# Patient Record
Sex: Female | Born: 1940 | Race: White | Hispanic: No | Marital: Married | Smoking: Current every day smoker
Health system: Southern US, Community
[De-identification: ages and names within clinical notes are randomized; demographics above are authoritative.]

## PROBLEM LIST (undated history)

## (undated) DIAGNOSIS — I10 Essential (primary) hypertension: Secondary | ICD-10-CM

## (undated) DIAGNOSIS — K5909 Other constipation: Secondary | ICD-10-CM

## (undated) DIAGNOSIS — K219 Gastro-esophageal reflux disease without esophagitis: Secondary | ICD-10-CM

## (undated) HISTORY — PX: APPENDECTOMY: SHX54

## (undated) HISTORY — PX: ABDOMINAL HYSTERECTOMY: SHX81

---

## 2014-08-17 ENCOUNTER — Emergency Department: Payer: Self-pay | Admitting: Emergency Medicine

## 2014-08-17 LAB — CBC WITH DIFFERENTIAL/PLATELET
Basophil #: 0 10*3/uL (ref 0.0–0.1)
Basophil %: 0.3 %
Eosinophil #: 0.1 10*3/uL (ref 0.0–0.7)
Eosinophil %: 0.9 %
HCT: 39.8 % (ref 35.0–47.0)
HGB: 13.3 g/dL (ref 12.0–16.0)
Lymphocyte #: 2.7 10*3/uL (ref 1.0–3.6)
Lymphocyte %: 29.2 %
MCH: 30.7 pg (ref 26.0–34.0)
MCHC: 33.4 g/dL (ref 32.0–36.0)
MCV: 92 fL (ref 80–100)
Monocyte #: 0.6 x10 3/mm (ref 0.2–0.9)
Monocyte %: 6.3 %
Neutrophil #: 5.8 10*3/uL (ref 1.4–6.5)
Neutrophil %: 63.3 %
Platelet: 271 10*3/uL (ref 150–440)
RBC: 4.33 10*6/uL (ref 3.80–5.20)
RDW: 13.2 % (ref 11.5–14.5)
WBC: 9.2 10*3/uL (ref 3.6–11.0)

## 2014-08-17 LAB — TROPONIN I: Troponin-I: <0.02 ng/mL

## 2014-08-17 LAB — URINALYSIS, COMPLETE
Bilirubin,UR: NEGATIVE
Glucose,UR: NEGATIVE mg/dL (ref 0–75)
Ketone: NEGATIVE
Leukocyte Esterase: NEGATIVE
Nitrite: NEGATIVE
Ph: 5 (ref 4.5–8.0)
Protein: NEGATIVE
Specific Gravity: 1.025 (ref 1.003–1.030)
Squamous Epithelial: >30

## 2014-08-17 LAB — COMPREHENSIVE METABOLIC PANEL
Albumin: 3.2 g/dL — ABNORMAL LOW (ref 3.4–5.0)
Alkaline Phosphatase: 83 U/L
Anion Gap: 8 (ref 7–16)
BUN: 19 mg/dL — ABNORMAL HIGH (ref 7–18)
Bilirubin,Total: 0.5 mg/dL (ref 0.2–1.0)
Calcium, Total: 9.1 mg/dL (ref 8.5–10.1)
Chloride: 104 mmol/L (ref 98–107)
Co2: 28 mmol/L (ref 21–32)
Creatinine: 0.78 mg/dL (ref 0.60–1.30)
EGFR (African American): >60
EGFR (Non-African Amer.): >60
Glucose: 123 mg/dL — ABNORMAL HIGH (ref 65–99)
Osmolality: 283 (ref 275–301)
Potassium: 3.7 mmol/L (ref 3.5–5.1)
SGOT(AST): 25 U/L (ref 15–37)
SGPT (ALT): 14 U/L
Sodium: 140 mmol/L (ref 136–145)
Total Protein: 7.4 g/dL (ref 6.4–8.2)

## 2014-08-17 LAB — LIPASE, BLOOD: Lipase: 67 U/L — ABNORMAL LOW (ref 73–393)

## 2014-08-21 ENCOUNTER — Encounter (HOSPITAL_COMMUNITY): Payer: Self-pay | Admitting: Emergency Medicine

## 2014-08-21 ENCOUNTER — Emergency Department (HOSPITAL_COMMUNITY)
Admission: EM | Admit: 2014-08-21 | Discharge: 2014-08-21 | Disposition: A | Discharge status: Home / Self Care | Payer: Medicare Other | Attending: Emergency Medicine | Admitting: Emergency Medicine

## 2014-08-21 DIAGNOSIS — M549 Dorsalgia, unspecified: Secondary | ICD-10-CM | POA: Insufficient documentation

## 2014-08-21 DIAGNOSIS — F17200 Nicotine dependence, unspecified, uncomplicated: Secondary | ICD-10-CM | POA: Diagnosis not present

## 2014-08-21 DIAGNOSIS — G8929 Other chronic pain: Secondary | ICD-10-CM | POA: Insufficient documentation

## 2014-08-21 DIAGNOSIS — Z79899 Other long term (current) drug therapy: Secondary | ICD-10-CM | POA: Diagnosis not present

## 2014-08-21 DIAGNOSIS — R1013 Epigastric pain: Secondary | ICD-10-CM | POA: Diagnosis present

## 2014-08-21 DIAGNOSIS — K5900 Constipation, unspecified: Secondary | ICD-10-CM | POA: Insufficient documentation

## 2014-08-21 DIAGNOSIS — K219 Gastro-esophageal reflux disease without esophagitis: Secondary | ICD-10-CM | POA: Diagnosis not present

## 2014-08-21 DIAGNOSIS — Z9089 Acquired absence of other organs: Secondary | ICD-10-CM | POA: Diagnosis not present

## 2014-08-21 DIAGNOSIS — F1210 Cannabis abuse, uncomplicated: Secondary | ICD-10-CM | POA: Insufficient documentation

## 2014-08-21 DIAGNOSIS — Z90710 Acquired absence of both cervix and uterus: Secondary | ICD-10-CM | POA: Insufficient documentation

## 2014-08-21 HISTORY — DX: Gastro-esophageal reflux disease without esophagitis: K21.9

## 2014-08-21 HISTORY — DX: Other constipation: K59.09

## 2014-08-21 LAB — CBG MONITORING, ED: Glucose-Capillary: 107 mg/dL — ABNORMAL HIGH (ref 70–99)

## 2014-08-21 LAB — CBC WITH DIFFERENTIAL/PLATELET
Basophils Absolute: 0 10*3/uL (ref 0.0–0.1)
Basophils Relative: 1 % (ref 0–1)
Eosinophils Absolute: 0.1 10*3/uL (ref 0.0–0.7)
Eosinophils Relative: 1 % (ref 0–5)
HCT: 39.3 % (ref 36.0–46.0)
Hemoglobin: 12.9 g/dL (ref 12.0–15.0)
Lymphocytes Relative: 36 % (ref 12–46)
Lymphs Abs: 3.1 10*3/uL (ref 0.7–4.0)
MCH: 30.1 pg (ref 26.0–34.0)
MCHC: 32.8 g/dL (ref 30.0–36.0)
MCV: 91.6 fL (ref 78.0–100.0)
Monocytes Absolute: 0.5 10*3/uL (ref 0.1–1.0)
Monocytes Relative: 6 % (ref 3–12)
Neutro Abs: 5 10*3/uL (ref 1.7–7.7)
Neutrophils Relative %: 56 % (ref 43–77)
Platelets: 268 10*3/uL (ref 150–400)
RBC: 4.29 MIL/uL (ref 3.87–5.11)
RDW: 13.4 % (ref 11.5–15.5)
WBC: 8.7 10*3/uL (ref 4.0–10.5)

## 2014-08-21 LAB — COMPREHENSIVE METABOLIC PANEL
ALT: 12 U/L (ref 0–35)
AST: 21 U/L (ref 0–37)
Albumin: 3.5 g/dL (ref 3.5–5.2)
Alkaline Phosphatase: 83 U/L (ref 39–117)
Anion gap: 15 (ref 5–15)
BUN: 19 mg/dL (ref 6–23)
CO2: 27 mEq/L (ref 19–32)
Calcium: 9.7 mg/dL (ref 8.4–10.5)
Chloride: 98 mEq/L (ref 96–112)
Creatinine, Ser: 0.66 mg/dL (ref 0.50–1.10)
GFR calc Af Amer: >90 mL/min (ref >90)
GFR calc non Af Amer: 84 mL/min — ABNORMAL LOW (ref >90)
Glucose, Bld: 101 mg/dL — ABNORMAL HIGH (ref 70–99)
Potassium: 3.5 mEq/L — ABNORMAL LOW (ref 3.7–5.3)
Sodium: 140 mEq/L (ref 137–147)
Total Bilirubin: 0.5 mg/dL (ref 0.3–1.2)
Total Protein: 7.4 g/dL (ref 6.0–8.3)

## 2014-08-21 MED ORDER — DOCUSATE SODIUM 100 MG PO CAPS: 100.0000 mg | ORAL_CAPSULE | Freq: Two times a day (BID) | ORAL | Status: DC

## 2014-08-21 MED ORDER — GI COCKTAIL ~~LOC~~
30.0000 mL | Freq: Once | ORAL | Status: AC
  Administered 2014-08-21: 30 mL via ORAL
  Filled 2014-08-21: qty 30

## 2014-08-21 MED ORDER — HYDROCODONE-ACETAMINOPHEN 5-325 MG PO TABS: 1.0000 | ORAL_TABLET | ORAL | Status: DC | PRN

## 2014-08-21 NOTE — ED Notes (Signed)
Pt family member becoming agitated. Delay explained to pt family. Pt family member remains agitated. AC notified.

## 2014-08-21 NOTE — Discharge Instructions (Signed)

## 2014-08-21 NOTE — ED Notes (Addendum)
Pt reports was diagnosed with Peptic Ulcer when she was evaluated at [facility] on Tuesday.

## 2014-08-21 NOTE — ED Notes (Addendum)
Pt c/o abdominal pain and loss of appetite since June. Pt states its a "burning pain" and states "I cannot go to the bathroom without medicine, it's been over a week, and pain gets worse when they eat." Pt also stated that she vomited Tuesday night and they took her to [facility]. Denies any diarrhea. Pt denies any blood in stool.

## 2014-08-21 NOTE — ED Provider Notes (Signed)
CSN: [id number]     Arrival date & time 08/21/14  1116 History  This chart was scribed for Christy Gaskins, MD by Elon Spanner, ED Scribe. This patient was seen in room APA03/APA03 and the patient's care was started at 3:22 PM.   Chief Complaint  Patient presents with  . Abdominal Pain   Patient is a 73 y.o. female presenting with abdominal pain. The history is provided by the patient. No language interpreter was used.  Abdominal Pain Pain location:  Epigastric Pain radiates to:  Back Pain severity:  Moderate Onset quality:  Gradual Duration:  2 weeks Timing:  Constant Chronicity:  Chronic Relieved by:  Nothing Worsened by:  Eating Ineffective treatments:  None tried Associated symptoms: constipation   Associated symptoms: no chest pain and no fever     HPI Comments: Christy Mccarthy is a 73 y.o. female who presents to the Emergency Department complaining of epigastric abdominal pain that worsened mildly in June and markedly 1 week ago with intermittent radiation through to her back.  Patient states the pain is worse after eating any food and reports current constipation with her most recent bowel movement 2 weeks ago. Patient reports 1 episode of emesis on 8/26. Patient was seen for a similar complaint on 8/25 on Tuesday and received several abdominal imaging studies and was diagnosed with a peptic ulcer.  She was prescribed Protonix and one other unknown medication.   Patient has a history of appendectomy and hysterectomy but denies other abdominal surgeries.  Patient denies CP, fever, melena, bloody stool.  No NSAID use  She is pain free at this time  PCP: none Past Medical History  Diagnosis Date  . Chronic abdominal pain   . Chronic constipation   . GERD (gastroesophageal reflux disease)    Past Surgical History  Procedure Laterality Date  . Appendectomy    . Abdominal hysterectomy     No family history on file. History  Substance Use Topics  . Smoking status: Current  Every Day Smoker -- 0.50 packs/day for 25 years  . Smokeless tobacco: Not on file  . Alcohol Use: No   OB History   Grav Para Term Preterm Abortions TAB SAB Ect Mult Living                 Review of Systems  Constitutional: Negative for fever.  Cardiovascular: Negative for chest pain.  Gastrointestinal: Positive for abdominal pain and constipation. Negative for blood in stool.  Musculoskeletal: Positive for back pain.  All other systems reviewed and are negative.   Allergies  Sucralfate and Sulfa antibiotics  Home Medications   Prior to Admission medications   Medication Sig Start Date End Date Taking? Authorizing Provider  pantoprazole (PROTONIX) 40 MG tablet Take 40 mg by mouth daily. 08/17/14  Yes Historical Provider, MD   BP 165/85  Pulse 62  Temp(Src) 98.8 F (37.1 C) (Oral)  Resp 16  Ht  (1.702 m)  Wt 133 lb (60.328 kg)  BMI 20.83 kg/m2  SpO2 100% Physical Exam  Nursing note and vitals reviewed.   CONSTITUTIONAL: Well developed/well nourished HEAD: Normocephalic/atraumatic EYES: EOMI/PERRL ENMT: Mucous membranes moist NECK: supple no meningeal signs SPINE:entire spine nontender CV: S1/S2 noted, no murmurs/rubs/gallops noted LUNGS: Lungs are clear to auscultation bilaterally, no apparent distress ABDOMEN: soft, nontender, no rebound or guarding GU:no cva tenderness NEURO: Pt is awake/alert, moves all extremitiesx4 EXTREMITIES: pulses normal, full ROM SKIN: warm, color normal PSYCH: no abnormalities of mood noted  ED  Course  Procedures   DIAGNOSTIC STUDIES: Oxygen Saturation is 100% on RA, normal by my interpretation.    COORDINATION OF CARE:  3:30 PM Discussed treatment plan with patient at bedside.  Patient acknowledges and agrees with plan.    Pt reports extensive workup recently in [place] including negative abdominal US I doubt acute cholecystitis.  I don't feel she needs emergent imaging and she is well appearing without pain She has no  pain at this time, I doubt pancreatitis Low suspicion for ACS She has no evidence of acute abdominal emergency She requests meds for pain and constipation Discussed strict return precautions Referred to local GI but advised I could not guarantee when she would be able to see them as outpatient and advised to call tomorrow for followup  Labs Review Labs Reviewed  COMPREHENSIVE METABOLIC PANEL - Abnormal; Notable for the following:    Potassium 3.5 (*)    Glucose, Bld 101 (*)    GFR calc non Af Amer 84 (*)    All other components within normal limits  CBG MONITORING, ED - Abnormal; Notable for the following:    Glucose-Capillary 107 (*)    All other components within normal limits  CBC WITH DIFFERENTIAL      MDM   Final diagnoses:  Epigastric pain    Nursing notes including past medical history and social history reviewed and considered in documentation Labs/vital reviewed and considered   I personally performed the services described in this documentation, which was scribed in my presence. The recorded information has been reviewed and is accurate.      Christy Gaskins, MD 08/21/14 [phone]

## 2014-08-22 ENCOUNTER — Telehealth: Payer: Self-pay | Admitting: Gastroenterology

## 2014-08-22 NOTE — Telephone Encounter (Signed)
LMOM to call.

## 2014-08-22 NOTE — Telephone Encounter (Signed)
Noted.  Will await to see patient's response once she is triaged by [facility].

## 2014-08-22 NOTE — Telephone Encounter (Signed)
Pt returned my call. She said she hurts in the top of her stomach whenever she eats. Also, stays constipated. She takes 3-4 laxatives at once time and it will be a couple of weeks before she has a BM. She said it has been 2 weeks now and she is miserable. She wanted to be seen ASAP but we do not have anything urgent for awhile. Please advise!

## 2014-08-22 NOTE — Telephone Encounter (Signed)
Pt called this morning wanting to make OV with SF ONLY. She had been seen in the ER over the weekend for abd pain. I told her that I could get her in with extender this Friday or she could see SF on 10/22 which is her next available. Pt said that she would call me back to see what she wanted to do. She calls back at noon asking for Friday appointment and it has already been taken for another patient. Patient got huffy that I didn't save it for her. I told her that I had another cancellation with [facility] on 9/16 at 10 and I could put her on the schedule and just keep an eye out for any sooner appts that may come up. She is getting frustrated that she can't be a walk in and can't be seen any sooner than what I have offered. Please advise if this is an Urgent matter where patient can be seen sooner. She can be reached at [phone]

## 2014-08-23 NOTE — Telephone Encounter (Signed)
PLEASE CALL PT. UNFORTUNATELY WE DO NOT HAVE ANY APPTS SOONER THAN SEP 16.   SHE SHOULD USE MIRALAX 17 GM IN 8 OZ LIQUID Q1H FOR FROM 12N TO 3 PM. DRINK 1 CUP LIQUID 30 MINS AFTER EACH DOSE. SHE SHOULD TAKE ON DOSES ON TUES AND WED THEN START MIRALAX TID.

## 2014-08-23 NOTE — Telephone Encounter (Signed)
I called and informed pt. She is aware of the instructions for the miralax. She is also aware of her appt with Tana Coast, PA on 09/07/2014 at 10:00 AM.

## 2014-09-02 ENCOUNTER — Other Ambulatory Visit: Payer: Self-pay | Admitting: Gastroenterology

## 2014-09-02 ENCOUNTER — Ambulatory Visit (INDEPENDENT_AMBULATORY_CARE_PROVIDER_SITE_OTHER): Payer: Medicare Other | Admitting: Gastroenterology

## 2014-09-02 ENCOUNTER — Encounter: Payer: Self-pay | Admitting: Gastroenterology

## 2014-09-02 VITALS — BP 143/82 | HR 91 | Temp 97.6°F | Ht 67.0 in | Wt 131.4 lb

## 2014-09-02 DIAGNOSIS — K219 Gastro-esophageal reflux disease without esophagitis: Secondary | ICD-10-CM

## 2014-09-02 DIAGNOSIS — K5900 Constipation, unspecified: Secondary | ICD-10-CM | POA: Insufficient documentation

## 2014-09-02 DIAGNOSIS — R634 Abnormal weight loss: Secondary | ICD-10-CM

## 2014-09-02 DIAGNOSIS — R1013 Epigastric pain: Secondary | ICD-10-CM

## 2014-09-02 MED ORDER — LINACLOTIDE 290 MCG PO CAPS: 290.0000 ug | ORAL_CAPSULE | Freq: Every day | ORAL | Status: DC

## 2014-09-02 MED ORDER — PANTOPRAZOLE SODIUM 40 MG PO TBEC: 40.0000 mg | DELAYED_RELEASE_TABLET | Freq: Every day | ORAL | Status: AC

## 2014-09-02 NOTE — Assessment & Plan Note (Signed)
Chronic constipation. Currently poorly managed. No prior colonoscopy. All for colonoscopy at this time the patient wanted to pursue upper endoscopy first. She also states that she will not take any sort of liquid bowel prep. Vomiting with all liquid medication including MiraLax recently. Possible to use pill prep. Also consider Cologuard if she decides to avoid any bowel prep. To discuss again later when she is ready.   Start Linzess daily. Stop dulcolax, but may use prn if needed on occasion.

## 2014-09-02 NOTE — Patient Instructions (Signed)
1. For constipation, start Linzess one daily on empty stomach. Prescription sent to your pharmacy. 2. For stomach pain, continue pantoprazole one daily. Prescription sent to your pharmacy. 3. You should consider colonoscopy at a later date. We can offer you a pill prep.  4. I suspect you may have gastritis or stomach ulcer since your symptoms have improved with pantoprazole. Therefore, we will perform an upper endoscopy to further evaluate. See separate instructions.   Peptic Ulcer A peptic ulcer is a sore in the lining of your esophagus (esophageal ulcer), stomach (gastric ulcer), or in the first part of your small intestine (duodenal ulcer). The ulcer causes erosion into the deeper tissue. CAUSES  Normally, the lining of the stomach and the small intestine protects itself from the acid that digests food. The protective lining can be damaged by:  An infection caused by a bacterium called Helicobacter pylori (H. pylori).  Regular use of nonsteroidal anti-inflammatory drugs (NSAIDs), such as ibuprofen or aspirin.  Smoking tobacco. Other risk factors include being older than 50, drinking alcohol excessively, and having a family history of ulcer disease.  SYMPTOMS   Burning pain or gnawing in the area between the chest and the belly button.  Heartburn.  Nausea and vomiting.  Bloating. The pain can be worse on an empty stomach and at night. If the ulcer results in bleeding, it can cause:  Black, tarry stools.  Vomiting of bright red blood.  Vomiting of coffee-ground-looking materials. DIAGNOSIS  A diagnosis is usually made based upon your history and an exam. Other tests and procedures may be performed to find the cause of the ulcer. Finding a cause will help determine the best treatment. Tests and procedures may include:  Blood tests, stool tests, or breath tests to check for the bacterium H. pylori.  An upper gastrointestinal (GI) series of the esophagus, stomach, and small  intestine.  An endoscopy to examine the esophagus, stomach, and small intestine.  A biopsy. TREATMENT  Treatment may include:  Eliminating the cause of the ulcer, such as smoking, NSAIDs, or alcohol.  Medicines to reduce the amount of acid in your digestive tract.  Antibiotic medicines if the ulcer is caused by the H. pylori bacterium.  An upper endoscopy to treat a bleeding ulcer.  Surgery if the bleeding is severe or if the ulcer created a hole somewhere in the digestive system. HOME CARE INSTRUCTIONS   Avoid tobacco, alcohol, and caffeine. Smoking can increase the acid in the stomach, and continued smoking will impair the healing of ulcers.  Avoid foods and drinks that seem to cause discomfort or aggravate your ulcer.  Only take medicines as directed by your caregiver. Do not substitute over-the-counter medicines for prescription medicines without talking to your caregiver.  Keep any follow-up appointments and tests as directed. SEEK MEDICAL CARE IF:   Your do not improve within 7 days of starting treatment.  You have ongoing indigestion or heartburn. SEEK IMMEDIATE MEDICAL CARE IF:   You have sudden, sharp, or persistent abdominal pain.  You have bloody or dark black, tarry stools.  You vomit blood or vomit that looks like coffee grounds.  You become light-headed, weak, or feel faint.  You become sweaty or clammy. MAKE SURE YOU:   Understand these instructions.  Will watch your condition.  Will get help right away if you are not doing well or get worse. Document Released: 12/06/2000 Document Revised: 04/25/2014 Document Reviewed: 07/08/2012 [facility] Patient Information 2015 [place], [place]. This information is not intended to  replace advice given to you by your health care provider. Make sure you discuss any questions you have with your health care provider.  

## 2014-09-02 NOTE — Assessment & Plan Note (Signed)
73 year old lady with three-month history of progressive postprandial epigastric pain associated with anorexia, 14 pound weight loss. Denies any NSAIDs or aspirin. Some improvement on pantoprazole recently. She reports negative gallbladder ultrasound recently. We have requested those records. Suspect gastritis or peptic ulcer disease. Differential also includes mesenteric ischemia, risk factor of chronic tobacco use.  Upper endoscopy planned.  I have discussed the risks, alternatives, benefits with regards to but not limited to the risk of reaction to medication, bleeding, infection, perforation and the patient is agreeable to proceed. Written consent to be obtained.  Continue pantoprazole 40 mg daily. Refills provided.

## 2014-09-02 NOTE — Progress Notes (Signed)
Primary Care Physician:  No PCP Per Patient  Primary Gastroenterologist:  Barney Drain, MD   Chief Complaint  Patient presents with  . Abdominal Pain    HPI:  Christy Mccarthy is a 73 y.o. female here as a self-referral for further evaluation of a worsening abdominal pain. She reports that over the past 10 years she has had issues with constipation and gas pains. She's had to take medication to have a bowel movement for years. Generally takes Dulcolax once per week. Chronically had been on Zantac for heartburn. However in June she started having increased issues. She started having postprandial epigastric/mid abdominal pain. Symptoms escalated last month. She started avoiding meals due to postprandial pain. She's had a 14 pound weight loss. Denies nausea or vomiting. No diarrhea. Bowel movement once per week. Denies melena or rectal bleeding.  She has been seen at [facility] regional ER, urgent care, [facility] ER for these symptoms. She does not have a PCP. She lives in [place] and refuses see doctors there.   Seen in urgent care on 08/13/2014. At that time she had normal CBC, met 7, TSH. Negative H. pylori antibody. Switch from Zantac to omeprazole 20 mg twice a day. Patient tells me that she or her try this at home so she didn't try again.  Seen at [facility] regional ER on 08/17/2014. She reports having abdominal ultrasound and x-ray but we do not have his records, they have been requested. She brought a copy of her lab work which showed troponin less than 0.32, white blood cell count 9200, hemoglobin 13.3, platelets 271,000, glucose 123, BUN 19, creatinine 0.78, sodium 140, potassium 3.7, calcium 9.1, total bilirubin 0.5, alkaline phosphatase 83, ALT 14, AST 25, albumin 3.2, lipase 67. Urinalysis showed 1+ blood, 0-5 rbc's on centrifuge specimen due to insufficient specimen. Started on pantoprazole 08/17/14. Given carafate tabs, "worsened my pain".    Seen [facility] ER 08/21/14. CBC, CMET  as below, unremarkable.  Some improvement on protonix. Have not used but one of vicodin.  Afraid to eat more than soup and jello. C/o severe constipation. Vomits with liquid medicine and could not tolerate miralax. Refuses colonoscopy if she has to take liquid prep. No prior colonoscopy.   Fears having cancer. Husband died of esophageal cancer.   Current Outpatient Prescriptions  Medication Sig Dispense Refill  . HYDROcodone-acetaminophen (NORCO/VICODIN) 5-325 MG per tablet Take 1 tablet by mouth every 4 (four) hours as needed for moderate pain or severe pain.  10 tablet  0  . pantoprazole (PROTONIX) 40 MG tablet Take 40 mg by mouth daily.       No current facility-administered medications for this visit.    Allergies as of 09/02/2014 - Review Complete 09/02/2014  Allergen Reaction Noted  . Sucralfate Other (See Comments) 08/21/2014  . Sulfa antibiotics Other (See Comments) 08/21/2014    Past Medical History  Diagnosis Date  . Chronic constipation   . GERD (gastroesophageal reflux disease)     Past Surgical History  Procedure Laterality Date  . Appendectomy    . Abdominal hysterectomy      Family History  Problem Relation Age of Onset  . Colon cancer Neg Hx   . Liver disease Neg Hx     History   Social History  . Marital Status: Unknown    Spouse Name: N/A    Number of Children: 2  . Years of Education: N/A   Occupational History  .     Social History Main Topics  .  Smoking status: Current Every Day Smoker -- 0.50 packs/day for 25 years  . Smokeless tobacco: Not on file  . Alcohol Use: No  . Drug Use: No  . Sexual Activity: Yes   Other Topics Concern  . Not on file   Social History Narrative  . No narrative on file      ROS:  General: Negative for  fever, chills. See hpi. Eyes: Negative for vision changes.  ENT: Negative for hoarseness, difficulty swallowing , nasal congestion. CV: Negative for chest pain, angina, palpitations, dyspnea on  exertion, peripheral edema.  Respiratory: Negative for dyspnea at rest, dyspnea on exertion, cough, sputum, wheezing.  GI: See history of present illness. GU:  Negative for dysuria, hematuria, urinary incontinence, urinary frequency, nocturnal urination.  MS: Negative for joint pain, low back pain.  Derm: Negative for rash or itching.  Neuro: Negative for weakness, abnormal sensation, seizure, frequent headaches, memory loss, confusion.  Psych: Negative for anxiety, depression, suicidal ideation, hallucinations.  Endo: see hpi  Heme: Negative for bruising or bleeding. Allergy: Negative for rash or hives.    Physical Examination:  BP 143/82  Pulse 91  Temp(Src) 97.6 F (36.4 C) (Oral)  Ht _0  (1.702 m)  Wt 131 lb 6.4 oz (59.603 kg)  BMI 20.58 kg/m2   General: Well-nourished, well-developed in no acute distress.  Head: Normocephalic, atraumatic.   Eyes: Conjunctiva pink, no icterus. Mouth: Oropharyngeal mucosa moist and pink , no lesions erythema or exudate. Neck: Supple without thyromegaly, masses, or lymphadenopathy.  Lungs: Clear to auscultation bilaterally.  Heart: Regular rate and rhythm, no murmurs rubs or gallops.  Abdomen: Bowel sounds are normal, mild epig tenderness, nondistended, no hepatosplenomegaly or masses, no abdominal bruits or    hernia , no rebound or guarding.   Rectal: not performed Extremities: No lower extremity edema. No clubbing or deformities.  Neuro: Alert and oriented x 4 , grossly normal neurologically.  Skin: Warm and dry, no rash or jaundice.   Psych: Alert and cooperative, normal mood and affect.  Labs: Lab Results  Component Value Date   WBC 8.7 08/21/2014   HGB 12.9 08/21/2014   HCT 39.3 08/21/2014   MCV 91.6 08/21/2014   PLT 268 08/21/2014   Lab Results  Component Value Date   CREATININE 0.66 08/21/2014   BUN 19 08/21/2014   NA 140 08/21/2014   K 3.5* 08/21/2014   CL 98 08/21/2014   CO2 27 08/21/2014   Lab Results  Component Value Date    ALT 12 08/21/2014   AST 21 08/21/2014   ALKPHOS 83 08/21/2014   BILITOT 0.5 08/21/2014     Imaging Studies: No results found.

## 2014-09-05 ENCOUNTER — Encounter (HOSPITAL_COMMUNITY): Payer: Self-pay | Admitting: Pharmacy Technician

## 2014-09-05 NOTE — Progress Notes (Signed)
No pcp per patient 

## 2014-09-06 ENCOUNTER — Ambulatory Visit (HOSPITAL_COMMUNITY)
Admission: RE | Admit: 2014-09-06 | Discharge: 2014-09-06 | Disposition: A | Discharge status: Home / Self Care | Payer: Medicare Other | Source: Ambulatory Visit | Attending: Gastroenterology | Admitting: Gastroenterology

## 2014-09-06 ENCOUNTER — Encounter (HOSPITAL_COMMUNITY)
Admission: RE | Disposition: A | Discharge status: Home / Self Care | Payer: Self-pay | Source: Ambulatory Visit | Attending: Gastroenterology

## 2014-09-06 ENCOUNTER — Encounter (HOSPITAL_COMMUNITY): Payer: Self-pay

## 2014-09-06 DIAGNOSIS — Z90710 Acquired absence of both cervix and uterus: Secondary | ICD-10-CM | POA: Insufficient documentation

## 2014-09-06 DIAGNOSIS — Z79899 Other long term (current) drug therapy: Secondary | ICD-10-CM | POA: Diagnosis not present

## 2014-09-06 DIAGNOSIS — R634 Abnormal weight loss: Secondary | ICD-10-CM | POA: Diagnosis not present

## 2014-09-06 DIAGNOSIS — K219 Gastro-esophageal reflux disease without esophagitis: Secondary | ICD-10-CM | POA: Diagnosis not present

## 2014-09-06 DIAGNOSIS — R1013 Epigastric pain: Secondary | ICD-10-CM | POA: Diagnosis present

## 2014-09-06 DIAGNOSIS — K2940 Chronic atrophic gastritis without bleeding: Secondary | ICD-10-CM | POA: Diagnosis not present

## 2014-09-06 DIAGNOSIS — K449 Diaphragmatic hernia without obstruction or gangrene: Secondary | ICD-10-CM | POA: Insufficient documentation

## 2014-09-06 DIAGNOSIS — K5900 Constipation, unspecified: Secondary | ICD-10-CM

## 2014-09-06 HISTORY — PX: ESOPHAGOGASTRODUODENOSCOPY: SHX5428

## 2014-09-06 SURGERY — EGD (ESOPHAGOGASTRODUODENOSCOPY)
Anesthesia: Moderate Sedation

## 2014-09-06 MED ORDER — SODIUM CHLORIDE 0.9 % IV SOLN
INTRAVENOUS | Status: DC
  Administered 2014-09-06: 09:00:00 via INTRAVENOUS

## 2014-09-06 MED ORDER — MIDAZOLAM HCL 5 MG/5ML IJ SOLN
INTRAMUSCULAR | Status: AC
  Filled 2014-09-06: qty 10

## 2014-09-06 MED ORDER — MIDAZOLAM HCL 5 MG/5ML IJ SOLN
INTRAMUSCULAR | Status: DC | PRN
  Administered 2014-09-06 (×2): 2 mg via INTRAVENOUS

## 2014-09-06 MED ORDER — VARENICLINE TARTRATE 0.5 MG PO TABS: ORAL_TABLET | ORAL | Status: DC

## 2014-09-06 MED ORDER — LIDOCAINE VISCOUS 2 % MT SOLN
OROMUCOSAL | Status: AC
  Filled 2014-09-06: qty 15

## 2014-09-06 MED ORDER — STERILE WATER FOR IRRIGATION IR SOLN
Status: DC | PRN
  Administered 2014-09-06: 10:00:00

## 2014-09-06 MED ORDER — MEPERIDINE HCL 100 MG/ML IJ SOLN
INTRAMUSCULAR | Status: AC
  Filled 2014-09-06: qty 2

## 2014-09-06 MED ORDER — MEPERIDINE HCL 100 MG/ML IJ SOLN
INTRAMUSCULAR | Status: DC | PRN
  Administered 2014-09-06 (×2): 25 mg via INTRAVENOUS

## 2014-09-06 MED ORDER — LIDOCAINE VISCOUS 2 % MT SOLN
OROMUCOSAL | Status: DC | PRN
  Administered 2014-09-06: 4 mL via OROMUCOSAL

## 2014-09-06 NOTE — Discharge Instructions (Signed)
YOUR UPPER ABDOMINAL PAIN is most likely DUE TO gastritis & reflux.  YOU HAVE A SMALL HIATAL HERNIA. I biopsied your stomach.   CONTINUE PROTONIX. TAKE 30 MINUTES PRIOR TO BREAKFAST.  ZANTAC HELPS MOST WHEN USED AS NEEDED.  FOLLOW A LOW FAT DIET. SEE INFO BELOW.  YOUR BIOPSY WILL BE BACK IN 14 DAYS.   FOLLOW UP IN DEC 2015.   UPPER ENDOSCOPY AFTER CARE Read the instructions outlined below and refer to this sheet in the next week. These discharge instructions provide you with general information on caring for yourself after you leave the hospital. While your treatment has been planned according to the most current medical practices available, unavoidable complications occasionally occur. If you have any problems or questions after discharge, call DR. Liseth Wann, [phone].  ACTIVITY  You may resume your regular activity, but move at a slower pace for the next 24 hours.   Take frequent rest periods for the next 24 hours.   Walking will help get rid of the air and reduce the bloated feeling in your belly (abdomen).   No driving for 24 hours (because of the medicine (anesthesia) used during the test).   You may shower.   Do not sign any important legal documents or operate any machinery for 24 hours (because of the anesthesia used during the test).    NUTRITION  Drink plenty of fluids.   You may resume your normal diet as instructed by your doctor.   Begin with a light meal and progress to your normal diet. Heavy or fried foods are harder to digest and may make you feel sick to your stomach (nauseated).   Avoid alcoholic beverages for 24 hours or as instructed.    MEDICATIONS  You may resume your normal medications.   WHAT YOU CAN EXPECT TODAY  Some feelings of bloating in the abdomen.   Passage of more gas than usual.    IF YOU HAD A BIOPSY TAKEN DURING THE UPPER ENDOSCOPY:  Eat a soft diet IF YOU HAVE NAUSEA, BLOATING, ABDOMINAL PAIN, OR VOMITING.    FINDING  OUT THE RESULTS OF YOUR TEST Not all test results are available during your visit. DR. Darrick Penna WILL CALL YOU WITHIN 7 DAYS OF YOUR PROCEDUE WITH YOUR RESULTS. Do not assume everything is normal if you have not heard from DR. Treyon Wymore IN ONE WEEK, CALL HER OFFICE AT [phone].  SEEK IMMEDIATE MEDICAL ATTENTION AND CALL THE OFFICE: [phone] IF:  You have more than a spotting of blood in your stool.   Your belly is swollen (abdominal distention).   You are nauseated or vomiting.   You have a temperature over 101F.   You have abdominal pain or discomfort that is severe or gets worse throughout the day.   Gastritis  Gastritis is an inflammation (the body's way of reacting to injury and/or infection) of the stomach. It is often caused by bacterial (germ) infections. It can also be caused BY ASPIRIN, BC/GOODY POWDER'S, (IBUPROFEN) MOTRIN, OR ALEVE (NAPROXEN), chemicals (including alcohol), SPICY FOODS, and medications. This illness may be associated with generalized malaise (feeling tired, not well), UPPER ABDOMINAL STOMACH cramps, and fever. One common bacterial cause of gastritis is an organism known as H. Pylori. This can be treated with antibiotics.   REFLUX   TREATMENT There are a number of non-prescription medicines used to treat reflux including: Antacids.  ZANTAC Proton-pump inhibitors: PRILOSEC (OMEPRAZOLE)  HOME CARE INSTRUCTIONS Eat 2-3 hours before going to bed.  Try to reach and  maintain a healthy weight. LOSE 10-20 LBS Do not eat just a few very large meals. Instead, eat 4 TO 6 smaller meals throughout the day.  Try to identify foods and beverages that make your symptoms worse, and avoid these.  Avoid tight clothing.  Do not exercise right after eating.   Low-Fat Diet BREADS, CEREALS, PASTA, RICE, DRIED PEAS, AND BEANS These products are high in carbohydrates and most are low in fat. Therefore, they can be increased in the diet as substitutes for fatty foods. They  too, however, contain calories and should not be eaten in excess. Cereals can be eaten for snacks as well as for breakfast.  Include foods that contain fiber (fruits, vegetables, whole grains, and legumes). Research shows that fiber may lower blood cholesterol levels, especially the water-soluble fiber found in fruits, vegetables, oat products, and legumes. FRUITS AND VEGETABLES It is good to eat fruits and vegetables. Besides being sources of fiber, both are rich in vitamins and some minerals. They help you get the daily allowances of these nutrients. Fruits and vegetables can be used for snacks and desserts. MEATS Limit lean meat, chicken, [place], and fish to no more than 6 ounces per day. Beef, Pork, and Lamb Use lean cuts of beef, pork, and lamb. Lean cuts include:  Extra-lean ground beef.  Arm roast.  Sirloin tip.  Center-cut ham.  Round steak.  Loin chops.  Rump roast.  Tenderloin.  Trim all fat off the outside of meats before cooking. It is not necessary to severely decrease the intake of red meat, but lean choices should be made. Lean meat is rich in protein and contains a highly absorbable form of iron. Premenopausal women, in particular, should avoid reducing lean red meat because this could increase the risk for low red blood cells (iron-deficiency anemia).  Chicken and [place] These are good sources of protein. The fat of poultry can be reduced by removing the skin and underlying fat layers before cooking. Chicken and [place] can be substituted for lean red meat in the diet. Poultry should not be fried or covered with high-fat sauces. Fish and Shellfish Fish is a good source of protein. Shellfish contain cholesterol, but they usually are low in saturated fatty acids. The preparation of fish is important. Like chicken and [place], they should not be fried or covered with high-fat sauces. EGGS Egg whites contain no fat or cholesterol. They can be eaten often. Try 1 to 2 egg whites  instead of whole eggs in recipes or use egg substitutes that do not contain yolk.  MILK AND DAIRY PRODUCTS Use skim or 1% milk instead of 2% or whole milk. Decrease whole milk, natural, and processed cheeses. Use nonfat or low-fat (2%) cottage cheese or low-fat cheeses made from vegetable oils. Choose nonfat or low-fat (1 to 2%) yogurt. Experiment with evaporated skim milk in recipes that call for heavy cream. Substitute low-fat yogurt or low-fat cottage cheese for sour cream in dips and salad dressings. Have at least 2 servings of low-fat dairy products, such as 2 glasses of skim (or 1%) milk each day to help get your daily calcium intake.  FATS AND OILS Butterfat, lard, and beef fats are high in saturated fat and cholesterol. These should be avoided.Vegetable fats do not contain cholesterol. AVOID coconut oil, palm oil, and palm kernel oil, WHICH are very high in saturated fats. These should be limited. These fats are often used in bakery goods, processed foods, popcorn, oils, and nondairy creamers. Vegetable shortenings and  some peanut butters contain hydrogenated oils, which are also saturated fats. Read the labels on these foods and check for saturated vegetable oils.  Desirable liquid vegetable oils are corn oil, cottonseed oil, olive oil, canola oil, safflower oil, soybean oil, and sunflower oil. Peanut oil is not as good, but small amounts are acceptable. Buy a heart-healthy tub margarine that has no partially hydrogenated oils in the ingredients. AVOID Mayonnaise and salad dressings often are made from unsaturated fats.  OTHER EATING TIPS Snacks  Most sweets should be limited as snacks. They tend to be rich in calories and fats, and their caloric content outweighs their nutritional value. Some good choices in snacks are graham crackers, melba toast, soda crackers, bagels (no egg), English muffins, fruits, and vegetables. These snacks are preferable to snack crackers, [place] fries, and chips.  Popcorn should be air-popped or cooked in small amounts of liquid vegetable oil.  Desserts Eat fruit, low-fat yogurt, and fruit ices instead of pastries, cake, and cookies. Sherbet, angel food cake, gelatin dessert, frozen low-fat yogurt, or other frozen products that do not contain saturated fat (pure fruit juice bars, frozen ice pops) are also acceptable.   COOKING METHODS Choose those methods that use little or no fat. They include: Poaching.  Braising.  Steaming.  Grilling.  Baking.  Stir-frying.  Broiling.  Microwaving.  Foods can be cooked in a nonstick pan without added fat, or use a nonfat cooking spray in regular cookware. Limit fried foods and avoid frying in saturated fat. Add moisture to lean meats by using water, broth, cooking wines, and other nonfat or low-fat sauces along with the cooking methods mentioned above. Soups and stews should be chilled after cooking. The fat that forms on top after a few hours in the refrigerator should be skimmed off. When preparing meals, avoid using excess salt. Salt can contribute to raising blood pressure in some people.  EATING AWAY FROM HOME Order entres, potatoes, and vegetables without sauces or butter. When meat exceeds the size of a deck of cards (3 to 4 ounces), the rest can be taken home for another meal. Choose vegetable or fruit salads and ask for low-calorie salad dressings to be served on the side. Use dressings sparingly. Limit high-fat toppings, such as bacon, crumbled eggs, cheese, sunflower seeds, and olives. Ask for heart-healthy tub margarine instead of butter.  Hiatal Hernia A hiatal hernia occurs when a part of the stomach slides above the diaphragm. The diaphragm is the thin muscle separating the belly (abdomen) from the chest. A hiatal hernia can be something you are born with or develop over time. Hiatal hernias may allow stomach acid to flow back into your esophagus, the tube which carries food from your mouth to your  stomach. If this acid causes problems it is called GERD (gastro-esophageal reflux disease).   SYMPTOMS Common symptoms of GERD are heartburn (burning in your chest). This is worse when lying down or bending over. It may also cause belching and indigestion. Some of the things which make GERD worse are:  Increased weight pushes on stomach making acid rise more easily.   Smoking markedly increases acid production.   Alcohol decreases lower esophageal sphincter pressure (valve between stomach and esophagus), allowing acid from stomach into esophagus.   Late evening meals and going to bed with a full stomach increases pressure.   Anything that causes an increase in acid production.    HOME CARE INSTRUCTIONS  Try to achieve and maintain an ideal body weight.  Avoid drinking alcoholic beverages.   DO NOT smokE.   Do not wear tight clothing around your chest or stomach.   Eat smaller meals and eat more frequently. This keeps your stomach from getting too full. Eat slowly.   Do not lie down for 2 or 3 hours after eating. Do not eat or drink anything 1 to 2 hours before going to bed.   Avoid caffeine beverages (colas, coffee, cocoa, tea), fatty foods, citrus fruits and all other foods and drinks that contain acid and that seem to increase the problems.   Avoid bending over, especially after eating OR STRAINING. Anything that increases the pressure in your belly increases the amount of acid that may be pushed up into your esophagus.

## 2014-09-06 NOTE — Op Note (Signed)
[facility] [address] [place], [address]   ENDOSCOPY PROCEDURE REPORT  PATIENT: Christy Mccarthy, Christy Mccarthy  MR#: [id number] BIRTHDATE: 05-30-1941 , 73  yrs. old GENDER: Female  ENDOSCOPIST: Jonette Eva, MD REFERRED BY: PROCEDURE DATE: 09/06/2014 PROCEDURE:   EGD w/ biopsy  INDICATIONS:Epigastric pain.   Weight loss: UNINTENTIONAL( 14 LBS). SMOKER > 20 YRS. MEDICATIONS: Demerol 50 mg IV and Versed 4 mg IV TOPICAL ANESTHETIC:   Viscous Xylocaine  DESCRIPTION OF PROCEDURE:     Physical exam was performed.  Informed consent was obtained from the patient after explaining the benefits, risks, and alternatives to the procedure.  The patient was connected to the monitor and placed in the left lateral position.  Continuous oxygen was provided by nasal cannula and IV medicine administered through an indwelling cannula.  After administration of sedation, the patients esophagus was intubated and the EG-[id number] ([id number])  endoscope was advanced under direct visualization to the second portion of the duodenum.  The scope was removed slowly by carefully examining the color, texture, anatomy, and integrity of the mucosa on the way out.  The patient was recovered in endoscopy and discharged home in satisfactory condition.   ESOPHAGUS: The mucosa of the esophagus appeared normal.  STOMACH: A small hiatal hernia was noted.   Moderate non-erosive gastritis (inflammation) was found in the gastric antrum and gastric body. Multiple biopsies were performed using cold forceps. DUODENUM: The duodenal mucosa showed no abnormalities in the bulb and second portion of the duodenum. COMPLICATIONS:   None  ENDOSCOPIC IMPRESSION: 1.   DYSPEPSIA AND WEIGHT LOSS MOST LIKELY DUE TO GERD/GASTRITIS, LESS LIKELY MESENTERIC ISCHEMIA OR PANCREATIC CA 2.   Small hiatal hernia 3.   MODERATE Non-erosive gastritis  RECOMMENDATIONS: CONTINUE PROTONIX.  TAKE 30 MINUTES PRIOR TO BREAKFAST. ZANTAC HELPS  MOST WHEN USED AS NEEDED. FOLLOW A LOW FAT DIET. BIOPSY WILL BE BACK IN 14 DAYS.  CONSIDER CT ABD PANCREATIC PROTOCOL OR CTA ABD/PELVIS TO EVALUATE FOR MESENTERIC ISCHEMIA.  FOLLOW UP IN DEC 2015.   REPEAT EXAM:   _______________________________ Rosalie DoctorJonette Eva, MD 09/06/2014 10:13 AM

## 2014-09-06 NOTE — Progress Notes (Signed)
REVIEWED-NO ADDITIONAL RECOMMENDATIONS. 

## 2014-09-06 NOTE — H&P (Signed)
  Primary Care Physician:  No PCP Per Patient Primary Gastroenterologist:  Dr. Darrick Penna  Pre-Procedure History & Physical: HPI:  Christy Mccarthy is a 73 y.o. female here for DYSPEPSIA.   Past Medical History  Diagnosis Date  . Chronic constipation   . GERD (gastroesophageal reflux disease)     Past Surgical History  Procedure Laterality Date  . Appendectomy    . Abdominal hysterectomy      Prior to Admission medications   Medication Sig Start Date End Date Taking? Authorizing Provider  HYDROcodone-acetaminophen (NORCO/VICODIN) 5-325 MG per tablet Take 1 tablet by mouth every 4 (four) hours as needed for moderate pain or severe pain. 08/21/14  Yes Joya Gaskins, MD  Linaclotide [facility]) 290 MCG CAPS capsule Take 1 capsule (290 mcg total) by mouth daily. On empty stomach. 09/02/14  Yes Tiffany Kocher, PA-C  pantoprazole (PROTONIX) 40 MG tablet Take 1 tablet (40 mg total) by mouth daily before breakfast. 09/02/14  Yes Tiffany Kocher, PA-C    Allergies as of 09/02/2014 - Review Complete 09/02/2014  Allergen Reaction Noted  . Sucralfate Other (See Comments) 08/21/2014  . Sulfa antibiotics Other (See Comments) 08/21/2014    Family History  Problem Relation Age of Onset  . Colon cancer Neg Hx   . Liver disease Neg Hx     History   Social History  . Marital Status: Married    Spouse Name: N/A    Number of Children: 2  . Years of Education: N/A   Occupational History  .     Social History Main Topics  . Smoking status: Current Every Day Smoker -- 0.50 packs/day for 25 years  . Smokeless tobacco: Not on file  . Alcohol Use: No  . Drug Use: No  . Sexual Activity: Yes   Other Topics Concern  . Not on file   Social History Narrative  . No narrative on file    Review of Systems: See HPI, otherwise negative ROS   Physical Exam: BP 183/67  Pulse 72  Temp(Src) 98.4 F (36.9 C) (Oral)  Resp 21  SpO2 100% General:   Alert,  pleasant and cooperative in NAD Head:   Normocephalic and atraumatic. Neck:  Supple; Lungs:  Clear throughout to auscultation.    Heart:  Regular rate and rhythm. Abdomen:  Soft, nontender and nondistended. Normal bowel sounds, without guarding, and without rebound.   Neurologic:  Alert and  oriented x4;  grossly normal neurologically.  Impression/Plan:     DYSPEPSIA  PLAN:  EGD TODAY

## 2014-09-07 ENCOUNTER — Ambulatory Visit: Payer: Medicare Other | Admitting: Gastroenterology

## 2014-09-08 ENCOUNTER — Encounter (HOSPITAL_COMMUNITY): Payer: Self-pay | Admitting: Gastroenterology

## 2014-09-12 ENCOUNTER — Telehealth: Payer: Self-pay | Admitting: Gastroenterology

## 2014-09-12 DIAGNOSIS — R1013 Epigastric pain: Secondary | ICD-10-CM

## 2014-09-12 DIAGNOSIS — R634 Abnormal weight loss: Secondary | ICD-10-CM

## 2014-09-12 NOTE — Telephone Encounter (Signed)
Pt called in on the doctor's line to the office with questions about Linzess and her bowel habits since taking it after her procedure. I told her that [facility] nurse was on the other line and since she had called in on doctor's line that I would take her number and have DS return her call. Please call [phone]

## 2014-09-13 NOTE — Telephone Encounter (Signed)
I spoke to pt. She said the Linzess 290 mcg is not working. She is still constipated. Goes several days without a BM and then has little hard balls.   Please advise!

## 2014-09-14 NOTE — Telephone Encounter (Signed)
PATIENT NIC'D  °

## 2014-09-14 NOTE — Telephone Encounter (Addendum)
PLEASE CALL PT. SHE SHOULD TAKE HER LINZESS WITH A MEAL NOT BEFORE. IT MAY WORK BETTER. IF SHE IS STILL NOT HAVING A BM EVERY 2-3 DAYS THEN SHE SHOULD ADD MOM PILLS 2 CAPSULES DAILY. DRINK WATER TO KEEP YOUR URINE LIGHT YELLOW. FOLLOW A HIGH FIBER DIET.  HER BIOPSY SHOWS GASTRITIS. CONTINUE PROTONIX. OPV DEC 2015 E 30 CONSTIPATION/GASTRITIS.

## 2014-09-15 NOTE — Telephone Encounter (Signed)
Pt stated that she has tried everything and it is still not working. She has to strain to have a BM. She would like to talk with [facility]F today if possible. Please advise

## 2014-09-19 NOTE — Telephone Encounter (Addendum)
REVIEWED. WILL CALL PT WITHIN 7 DAYS.

## 2014-09-22 ENCOUNTER — Telehealth: Payer: Self-pay | Admitting: Gastroenterology

## 2014-09-22 NOTE — Telephone Encounter (Signed)
See telephone note 09/12/14, the 09/19/14 entry from SLF. She plans to call patient.  I reviewed EGD report. She has a very small hiatal hernia. She has gastritis. She is already taking pantoprazole which will help with that.   Patient refuses any liquid or powder medication for constipation. We can try Amitiza 24mcg BID instead of Linzess if she would like. OK to send in RX #60, 3 refills.  Dr. Darrick PennaFields mentioned possible CTA A/P vs CT Abd pancreatic protocol. Doris, please see if Dr. Darrick PennaFields wants to pursue.

## 2014-09-22 NOTE — Telephone Encounter (Signed)
Pt returned call and was informed. She wants to try the Amitiza first and see how that does, before we schedule the CT.  I called the Rx to the pharmacy [facility] and told them for pt to take with food.

## 2014-09-22 NOTE — Telephone Encounter (Signed)
Pt called today frustrated that SF or LSL hasn't called her. She has spoke with the nurse recently regarding her Linzess and was advised how to take it and when to take it. She said that Linzess is making things worse, causing her to be bloated with stomach cramps and constipation. She has concerns about hiatal hernia and chronic gastritis and why couldn't we prescribe something for her for that instead of Linzess.  She also said that she had a procedure about 2 weeks ago and hasn't heard from anyone other than the nurse. I tried to tell her that the nurse is usually the one that will call, but she wants to speak with LSL and/or SF. She said her nerves are shot and needs some relief. Please call her ASAP at [phone]

## 2014-09-22 NOTE — Telephone Encounter (Signed)
LMOM for pt to return call. ( I also sent staff message to Dr. Darrick PennaFields inquiring about the CT)

## 2014-09-22 NOTE — Telephone Encounter (Signed)
LMOM for a return call.  

## 2014-09-22 NOTE — Telephone Encounter (Signed)
I spoke to pt and she is very upset and wants somethng to be done for her ASAP. She said she cannot tolerate the Linzess any way that she takes it. She is concerned about the hiatal hernia and gastritis and said she wants to know what she can take for that. She said her nerves are all messed up dealing with this and she asked if they could prescribe something for her nerves. I told her that we do not prescribe nerve meds, she has to get those from PCP. She does not have a PCP at this time, but is looking for one. Pt wants to know what she should do. ( She will be out until after 3:30 PM today, but Ok to leave a message if we try to call and she will return the call.

## 2014-09-26 ENCOUNTER — Encounter: Payer: Self-pay | Admitting: Gastroenterology

## 2014-09-26 NOTE — Assessment & Plan Note (Signed)
CTA IN FUTURE

## 2014-09-26 NOTE — Telephone Encounter (Signed)
REVIEWED. AMITIZA TOO EXPENSIVE.

## 2014-09-26 NOTE — Telephone Encounter (Signed)
I REVIEWED CASE WITH DR. Tyron RussellBOLES. RECOMMENDED CT ANGIO A/P. Called patient TO DISCUSS PLAN. LVM TO CALL [phone] TO DISCUSS.

## 2014-09-26 NOTE — Telephone Encounter (Addendum)
Called patient TO DISCUSS CONCERNS. LVM-CALL [phone] TO DISCUSS. PT RETURNED CALL. THIS PAST WEEK HAD 3 BMs.  LINZESS CAUSED CRAMPING AND BLOATING. CAN'T AFFORD AMITIZA(OUT OF POCKET $236)-[employer]AGE. OFFERED TO PT TO PICKUP SAMPLES. SHE SAID SHE LIVES IN [place]. SUGGESTED SHE SEE HER PCP. SHE DOES NOT HAVE A PCP IN [place]. DULCOLAX USED TO WORK UNTIL IT STARTED MAKING HER THROW UP. COULD NOT AFFORD CHANTIX. TRYING TO QUIT ON HER OWN.  MAY USE PROTONIX BID IF EPIGASTRIC PAIN NOT WELL CONTROLLED. GOAL: 2-3 BMs A WEEK. ADD MOM 30 ML IF CONCENTRATED OR 60 ML IF REGULAR LIQUID QHS. PT WILL CALL IN 2-3 WEEKS IF SX NOT CONTROLLED. MAY NEED TO TRY MIRALAX.

## 2014-09-26 NOTE — Addendum Note (Signed)
Addended by: West BaliFIELDS, SANDI L on: 09/26/2014 02:20 PM   Modules accepted: Orders

## 2014-09-26 NOTE — Assessment & Plan Note (Signed)
CTA IN FUTURE 

## 2014-09-26 NOTE — Telephone Encounter (Signed)
Pt aware of her appointment for her CT scan on 09-30-14 @ 8:30

## 2014-09-26 NOTE — Addendum Note (Signed)
Addended by: West BaliFIELDS, Linea Calles L on: 09/26/2014 02:01 PM   Modules accepted: Orders, Medications

## 2014-09-28 ENCOUNTER — Other Ambulatory Visit (HOSPITAL_COMMUNITY): Payer: Medicare Other

## 2014-09-30 ENCOUNTER — Ambulatory Visit (HOSPITAL_COMMUNITY)
Admission: RE | Admit: 2014-09-30 | Discharge: 2014-09-30 | Disposition: A | Discharge status: Home / Self Care | Payer: Medicare Other | Source: Ambulatory Visit | Attending: Gastroenterology | Admitting: Gastroenterology

## 2014-09-30 DIAGNOSIS — I708 Atherosclerosis of other arteries: Secondary | ICD-10-CM | POA: Insufficient documentation

## 2014-09-30 DIAGNOSIS — R634 Abnormal weight loss: Secondary | ICD-10-CM | POA: Insufficient documentation

## 2014-09-30 DIAGNOSIS — K550 Acute vascular disorders of intestine: Secondary | ICD-10-CM | POA: Diagnosis not present

## 2014-09-30 DIAGNOSIS — R109 Unspecified abdominal pain: Secondary | ICD-10-CM | POA: Diagnosis present

## 2014-09-30 DIAGNOSIS — R1013 Epigastric pain: Secondary | ICD-10-CM

## 2014-09-30 MED ORDER — IOHEXOL 350 MG/ML SOLN
100.0000 mL | Freq: Once | INTRAVENOUS | Status: AC | PRN
  Administered 2014-09-30: 100 mL via INTRAVENOUS

## 2014-10-03 ENCOUNTER — Telehealth: Payer: Self-pay | Admitting: Gastroenterology

## 2014-10-03 NOTE — Telephone Encounter (Signed)
Patient called inquiring about results from radiology procedure [id number]

## 2014-10-03 NOTE — Telephone Encounter (Signed)
Routing to SLF for results.

## 2014-10-04 ENCOUNTER — Encounter: Payer: Self-pay | Admitting: Gastroenterology

## 2014-10-04 ENCOUNTER — Other Ambulatory Visit: Payer: Self-pay

## 2014-10-04 DIAGNOSIS — I998 Other disorder of circulatory system: Secondary | ICD-10-CM

## 2014-10-04 NOTE — Telephone Encounter (Signed)
Pt called and was informed. OK to refer. Routing to Ginger.

## 2014-10-04 NOTE — Telephone Encounter (Signed)
Referrals have been made 

## 2014-10-04 NOTE — Telephone Encounter (Addendum)
PLEASE CALL PT. 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 SHE NEEDS TO SEE A VASCULAR SURGEON AND THE INTERVENTIONAL RADIOLOGY(DX: MESENTERIC ISCHEMIA/STENOSIS, WEIGHT LOSS, STENOSIS OF THE AORTA.) TO SEE IF THEY ARE ABLE TO CORRECT THE BLOCKAGES. SHE SHOULD CONTINUE PROTONIX DAILY AND ADD ASA 325 MG DAILY WITH FOOD OR MILK. OPV E30 DEC 2015 WEIGHT LOSS, MESENTERIC STENOSIS, CONSTIPATION.

## 2014-10-04 NOTE — Telephone Encounter (Signed)
Pt is aware of OV on 12/3 at 3 with SF and appt card mailed

## 2014-10-05 ENCOUNTER — Telehealth: Payer: Self-pay | Admitting: Gastroenterology

## 2014-10-05 NOTE — Telephone Encounter (Addendum)
Called patient TO DISCUSS CONCERNS. EXPLAINED FINDINGS ON CT ANGIOGRAPHY. BROTHER GAVE HER SOME PILLS(SENOLAX)  AND HAD BOWEL MOVEMENTS TO RELIEVE CONSTIPATION. HER ABDOMINAL PAIN FEELS BETTER BUT SHE DOES HAVE CLAUDICATION IN HER LEFT LEG WITH PROLONGED WALKING.  DISCUSSED WITH DR. HASSELL. FEELS HE MAY BE ABLE TO HELP PT. DISCUSSED WITH PT. WOULD LIKE TO HOLD OFF ON VASCULAR SURGERY REFERRAL FOR NOW. CANCEL VASCULAR SURGERY REFERRAL.  PROCEED WITH IR EVAL.

## 2014-10-05 NOTE — Telephone Encounter (Signed)
Patient called on line 6 asking to speak with SF. I told her that SF was at lunch and I would take her number and SF/nurse would have to call her back this afternoon. She agreed. Please call [phone]

## 2014-10-05 NOTE — Telephone Encounter (Signed)
I called pt and she said she really does want to discuss her issues with Dr. Darrick PennaFields.  She has questions about the referral to Vascular Surgeon. Also, she said she is doing good on the Protonix. She did have some problems with constipation but her brother who goes to the [facility] gave her some tablets to try. Senolax, and it helped a lot. She would like to discuss with Dr. Darrick PennaFields.

## 2014-10-06 NOTE — Telephone Encounter (Signed)
Canceled vascular referral

## 2014-10-10 ENCOUNTER — Other Ambulatory Visit: Payer: Self-pay | Admitting: Gastroenterology

## 2014-10-10 DIAGNOSIS — K559 Vascular disorder of intestine, unspecified: Secondary | ICD-10-CM

## 2014-10-19 ENCOUNTER — Ambulatory Visit
Admission: RE | Admit: 2014-10-19 | Discharge: 2014-10-19 | Disposition: A | Discharge status: Home / Self Care | Payer: Medicare Other | Source: Ambulatory Visit | Attending: Gastroenterology | Admitting: Gastroenterology

## 2014-10-19 DIAGNOSIS — K559 Vascular disorder of intestine, unspecified: Secondary | ICD-10-CM

## 2014-10-19 NOTE — Consult Note (Signed)
Chief Complaint: Chief Complaint  Patient presents with  . Advice Only    Consult for Mesenteric Ischemia    Referring Physician(s): Fields,Sandi L  History of Present Illness: Christy Mccarthy is a 73 y.o. female with history of abdominal pain. The patient had significant abdominal pain during the summer that lead to an emergency department visit. She underwent endoscopy which demonstrated dyspepsia, gastritis and a small hiatal hernia. Patient also had a stomach biopsy which demonstrated moderate chronic gastritis. The patient started taking Protonix in September and says that her abdominal pain markedly improved. She still occasionally has pain in the lower abdomen. Since the abdominal pain started this summer, she has lost approximately 20 pounds. At this time, she does not have significant pain after eating but feels like she gets full after eating only a small amount of food. Her main abdominal complaint is now constipation. When her bowel movements are regular she does not have any significant abdominal symptoms. 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 The patient is a long-time smoker but denies any respiratory issues. She does complain of left lower extremity pain. She complains of left lower extremity pain while walking during errands or grocery shopping. Pain will require her to stop moving for a while.  Past Medical History  Diagnosis Date  . Chronic constipation   . GERD (gastroesophageal reflux disease)     Past Surgical History  Procedure Laterality Date  . Appendectomy    . Abdominal hysterectomy    . Esophagogastroduodenoscopy N/A 09/06/2014    Procedure: ESOPHAGOGASTRODUODENOSCOPY (EGD);  Surgeon: West BaliSandi L Fields, MD;  Location: [facility];  Service: Endoscopy;   Laterality: N/A;  10:00    Allergies: Linzess; Sucralfate; and Sulfa antibiotics  Medications: Prior to Admission medications   Medication Sig Start Date End Date Taking? Authorizing Provider  pantoprazole (PROTONIX) 40 MG tablet Take 1 tablet (40 mg total) by mouth daily before breakfast. 09/02/14  Yes Tiffany KocherLeslie S Lewis, PA-C  HYDROcodone-acetaminophen (NORCO/VICODIN) 5-325 MG per tablet Take 1 tablet by mouth every 4 (four) hours as needed for moderate pain or severe pain. 08/21/14   Joya Gaskinsonald W Wickline, MD  varenicline (CHANTIX) 0.5 MG tablet 1 PO QD FOR 3 DAYS THEN 1 PO BID FOR 4 DAYS THEN 2 PO BID FOR 12 WEEKS. IT MAY CAUSE DEPRESSION, NAUSEA, OR SUICIDAL THOUGHTS. 09/06/14   West BaliSandi L Fields, MD    Family History  Problem Relation Age of Onset  . Colon cancer Neg Hx   . Liver disease Neg Hx     History   Social History  . Marital Status: Married    Spouse Name: N/A    Number of Children: 2  . Years of Education: N/A   Occupational History  .     Social History Main Topics  . Smoking status: Current Every Day Smoker -- 0.50 packs/day for 25 years  . Smokeless tobacco: Not on file  . Alcohol Use: No  . Drug Use: No  . Sexual Activity: Yes   Other Topics Concern  . Not on file   Social History Narrative  . No narrative on file     Review of Systems  Constitutional: Positive for unexpected weight change.  HENT: Negative.   Eyes: Negative.   Respiratory: Negative.   Cardiovascular: Negative.   Gastrointestinal: Positive  for abdominal pain and constipation.  Endocrine: Negative.   Genitourinary: Negative.   Skin: Negative.   Allergic/Immunologic: Negative.   Neurological: Negative.   Hematological: Negative.   Psychiatric/Behavioral: Negative.   MSK:  Left leg pain with walking   Vital Signs: BP 188/82  Pulse 75  Temp(Src) 98.2 F (36.8 C) (Oral)  Resp 14  Ht 5' 7.5" (1.715 m)  Wt 125 lb (56.7 kg)  BMI 19.28 kg/m2  SpO2 99%  Physical Exam    Constitutional: She appears well-developed.  Cardiovascular: Normal rate, regular rhythm and normal heart sounds.   Pedal pulses could not be palpated. Doppler signal identified in the right posterior tibial artery, right dorsalis pedis artery and the left posterior tibial artery. Could not obtain Doppler signal in the left dorsalis pedis artery.  Pulmonary/Chest: Effort normal. She has wheezes.  Wheezes in the right upper lung.  Abdominal: Soft. Bowel sounds are normal.  Prominent bruit in the abdomen.    Imaging: Ct Angio Abd/pel W/ And/or W/o  09/30/2014   CLINICAL DATA:  Abdominal pain and weight loss  EXAM: CT ANGIOGRAPHY ABDOMEN AND PELVIS WITH CONTRAST AND WITHOUT CONTRAST  TECHNIQUE: Multidetector CT imaging of the abdomen and pelvis was performed using the standard protocol during bolus administration of intravenous contrast. Multiplanar reconstructed images and MIPs were obtained and reviewed to evaluate the vascular anatomy.  CONTRAST:  OMNIPAQUE IOHEXOL 350 MG/ML SOLN  COMPARISON:  None.  FINDINGS: The aorta is non aneurysmal with diffuse atherosclerotic calcifications. There is calcified plaque in the distal aorta just above the bifurcation which is likely and ulcerated plaque. This causes 50% narrowing of the lumen of the aorta.  There is densely calcified plaque at the origin of the celiac axis. There is probably a significant narrowing at its origin. Branch vessels are patent.  There is chronic occlusion of the proximal SMA extending 3 cm from its origin. The vessel reconstitutes most likely from of the left hilar cup branches of the IMA. A large collateral vessel is seen extending to the lower SMA. The SMA is diffusely diseased above the collateral vessel insertion.  The origin of the IMA is markedly calcified. Exact determination of stenosis is difficult. Significant narrowing cannot be excluded. The vessel is hypertrophied. Left colloid branches hypertrophied leading to the SMA.  Branch vessels are patent.  There is extensive calcified plaque at the origin of the right renal artery. Significant narrowing cannot be excluded. The origin of the left renal artery is also calcified but the vessel is widely patent.  There is dense calcified plaque at the origin of both common iliac arteries. Bilateral proximal common iliac artery significant stenosis cannot be excluded. Beyond the origin, the right common iliac artery is diffusely calcified and patent. There is dense calcified plaque at the origin of the right external iliac artery, and again, significant narrowing cannot be excluded. Beyond the origin, the right external iliac artery is patent. Right internal iliac artery is diffusely diseased.  Beyond the origin, the left common iliac artery is diffusely calcified and patent. Left external iliac artery is diminutive and patent. Left internal iliac artery is diffusely diseased.  There is plaque in the right common femoral artery without significant narrowing. The proximal superficial femoral artery is moderately diseased.  There is extensive plaque in the left common femoral artery and significant narrowing is suspected in a diffuse fashion. The prox superficial femoral artery is moderately diseased.  The liver, gallbladder, pancreas, and adrenal glands are within normal limits.  The spleen is irregular and somewhat shrunken. There are nonspecific linear and ill-defined low-density areas in the spleen. 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  There are several hypodensities within the left kidney. The the largest measuring 7 mm on image 18 is a simple cyst. Others are too small to characterize. There probably also benign cysts.  Sigmoid diverticulosis without acute diverticulitis.  Moderate degenerative disc disease at L5-S1 with vacuum disc. No spinal stenosis. No vertebral compression deformity.  No free-fluid.  No abnormal retroperitoneal adenopathy.  Review of the  MIP images confirms the above findings.  IMPRESSION: There is long segment occlusion of the proximal SMA likely the cause of the patient's symptoms.  There is a collateral vessel from the left, lytic artery and IMA likely the source of the reconstitution of the SMA. There is dense calcified plaque at the origin of the IMA and significant narrowing may be present.  Densely calcified plaque at the origin of the celiac. Again, significant stenosis may be present.  Significant stenosis is suggested at the origin of the right renal artery. Dense calcified plaque makes exact visualization difficult.  Extensive plaque at the origin of both common iliac artery suggesting significant stenosis.  Significant stenosis at the origin of the right external iliac artery is suspected.  Extensive plaque and narrowing involving the left common femoral artery.  Plaque at the distal aorta causing estimated 50% narrowing of the lumen.  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   Electronically Signed   By: Maryclare Bean M.D.   On: 09/30/2014 09:16    Labs:  CBC:  Recent Labs  08/21/14 1221  WBC 8.7  HGB 12.9  HCT 39.3  PLT 268    COAGS: No results found for this basename: INR, APTT,  in the last 8760 hours  BMP:  Recent Labs  08/21/14 1221  NA 140  K 3.5*  CL 98  CO2 27  GLUCOSE 101*  BUN 19  CALCIUM 9.7  CREATININE 0.66  GFRNONAA 84*  GFRAA >90    LIVER FUNCTION TESTS:  Recent Labs  08/21/14 1221  BILITOT 0.5  AST 21  ALT 12  ALKPHOS 83  PROT 7.4  ALBUMIN 3.5     Assessment and Plan:  Abdominal symptoms and mesenteric artery stenosis: There appears to be many causes for her abdominal symptoms. Endoscopy and biopsy have demonstrated dyspepsia, hiatal hernia and chronic gastritis. Her abdominal symptoms have markedly improved since she was started on Protonix. Her main complaint right now is constipation. However, the patient does have severe atherosclerotic  disease in her mesenteric arteries with a 3 cm occlusion of the superior mesenteric artery and a severe stenosis at the origin of the celiac trunk. The SMA is getting collateral flow through the IMA. This mesenteric artery disease can certainly be a cause for abdominal symptoms. However, the patient does not have significant pain after eating or "fear of food" based on my discussion. She feels that her abdominal symptoms improve when she is not constipated. 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 We reviewed her CTA images.  We could offer her endovascular treatment of the celiac artery stenosis with angioplasty and stent placement. It would be difficult to treat the superior mesenteric artery occlusion with endovascular techniques. Based on the patient's symptoms, I do not think that we necessarily need to treat the mesenteric atherosclerotic disease at this time since  the symptoms have improved with medication. However, I would like to get a surgical opinion with regards to bypass options. Will refer the patient to Vascular Surgery.   Left lower extremity pain : The patient has left lower extremity claudication symptoms. She does not have strong distal pulses. In addition, the CTA images demonstrate extensive calcified plaque in the left groin.  Suspect her lower extremity symptoms are related to peripheral vascular disease.  Will have Vascular Surgery evaluate patient's peripheral vascular disease.    Thank you for this interesting consult.  I greatly enjoyed meeting Christy OmsCarolyn Sue Canoy and look forward to participating in their care.      I spent a total of 45 minutes face to face in clinical consultation, greater than 50% of which was counseling/coordinating care for mesenteric artery stenosis.  SignedAbundio Miu:  Deanie Jupiter RYAN [phone] Pager 10/19/2014, 2:55 PM

## 2014-10-24 ENCOUNTER — Telehealth: Payer: Self-pay

## 2014-10-24 ENCOUNTER — Telehealth: Payer: Self-pay | Admitting: Surgery

## 2014-10-24 DIAGNOSIS — Z01818 Encounter for other preprocedural examination: Secondary | ICD-10-CM

## 2014-10-24 DIAGNOSIS — I739 Peripheral vascular disease, unspecified: Secondary | ICD-10-CM

## 2014-10-24 NOTE — Telephone Encounter (Signed)
-----   Message from Nada LibmanVance W Brabham, MD sent at 10/24/2014  7:14 AM EST ----- Regarding: FW: NEW REFERRAL Can we get this patient worked in soon? ----- Message -----    From: Jari Sportsmanammy Bangle, EMT    Sent: 10/20/2014   5:02 PM      To: Nada LibmanVance W Brabham, MD Subject: NEW REFERRAL                                   Dr Myra GianottiBrabham,  Dr Lowella DandyHenn would like to refer this pt to you to evaluate her for PVD and surgical opinion with regards to bypass options.  All of her records are in [facility].  Your office is already closed and I am off tomorrow.   If you or your office needs anything from me please let me know.   Thank you,  Jari Sportsmanammy Bangle, EMT 10/20/2014 5:07 PM Interventional Services [phone]

## 2014-10-24 NOTE — Telephone Encounter (Signed)
Per Dr. Myra GianottiBrabham, schedule pt. for Carotid Duplex and ABI's, with a first available appt.  Pt. will be notified to schedule appts.

## 2014-10-24 NOTE — Telephone Encounter (Signed)
Pt would like to talk with you. She when to the IR on 10/19/14 and now they want her to go see another doctor. She does not understand why she has to keep going to all these doctors. She would like for you to look over everything in her chart and call her.

## 2014-10-24 NOTE — Telephone Encounter (Addendum)
-----   Message from Nada LibmanVance W Brabham, MD sent at 10/24/2014  7:14 AM EST ----- Regarding: FW: NEW REFERRAL Can we get this patient worked in soon? ----- Message -----    From: Jari Sportsmanammy Bangle, EMT    Sent: 10/20/2014   5:02 PM      To: Nada LibmanVance W Brabham, MD Subject: NEW REFERRAL                                   Dr Myra GianottiBrabham,  Dr Lowella DandyHenn would like to refer this pt to you to evaluate her for PVD and surgical opinion with regards to bypass options.  All of her records are in [facility].  Your office is already closed and I am off tomorrow.   If you or your office needs anything from me please let me know.   Thank you,  Jari Sportsmanammy Bangle, EMT 10/20/2014 5:07 PM Interventional Services [phone]   notified patient of appt. with dr. Myra Gianottibrabham on 11-21-14 at 1:00, sent np info

## 2014-10-31 ENCOUNTER — Encounter: Payer: Self-pay | Admitting: Gastroenterology

## 2014-11-11 NOTE — Telephone Encounter (Signed)
PLEASE CALL PT. DR. HENN WANTS HER TO SEE THE VASCULAR SURGEON FOR HER LOWER LEG PAIN. HE THINKS WE SHOULD HOLD OFF ON DOING ANY INTERVENTIONS ON HER ABDOMEN BUT HE THINKS SHE COULD BENEFIT FROM A PROCEDURE TO RESTORE BLOOD FLOW TO HER LEFT LEG SO HE REFERRED HER TO VASCULAR SURGERY.

## 2014-11-14 NOTE — Telephone Encounter (Signed)
Called and spoke with family member. Pt was not available and family will have her call our office back

## 2014-11-15 NOTE — Telephone Encounter (Signed)
Spoke with patient this morning and she has appt with Dr. Linnell FullingBraham on 11/21/2014. She also has imaging appts with [facility] that day.  She is wondering if she still needs to keep her appt with Dr. Darrick PennaFields on 11/24/2014. Please advise

## 2014-11-16 ENCOUNTER — Encounter: Payer: Self-pay | Admitting: Surgery

## 2014-11-21 ENCOUNTER — Ambulatory Visit (HOSPITAL_COMMUNITY)
Admission: RE | Admit: 2014-11-21 | Discharge: 2014-11-21 | Disposition: A | Discharge status: Home / Self Care | Payer: Medicare Other | Source: Ambulatory Visit | Attending: Surgery | Admitting: Surgery

## 2014-11-21 ENCOUNTER — Encounter: Payer: Self-pay | Admitting: Surgery

## 2014-11-21 ENCOUNTER — Ambulatory Visit (INDEPENDENT_AMBULATORY_CARE_PROVIDER_SITE_OTHER)
Admission: RE | Admit: 2014-11-21 | Discharge: 2014-11-21 | Disposition: A | Discharge status: Home / Self Care | Payer: Medicare Other | Source: Ambulatory Visit | Attending: Surgery | Admitting: Surgery

## 2014-11-21 ENCOUNTER — Ambulatory Visit (INDEPENDENT_AMBULATORY_CARE_PROVIDER_SITE_OTHER): Payer: Medicare Other | Admitting: Surgery

## 2014-11-21 VITALS — BP 166/78 | HR 70 | Temp 98.1°F | Resp 16 | Ht 67.0 in | Wt 124.0 lb

## 2014-11-21 DIAGNOSIS — I70213 Atherosclerosis of native arteries of extremities with intermittent claudication, bilateral legs: Secondary | ICD-10-CM | POA: Insufficient documentation

## 2014-11-21 DIAGNOSIS — I70219 Atherosclerosis of native arteries of extremities with intermittent claudication, unspecified extremity: Secondary | ICD-10-CM

## 2014-11-21 DIAGNOSIS — I739 Peripheral vascular disease, unspecified: Secondary | ICD-10-CM

## 2014-11-21 DIAGNOSIS — Z01818 Encounter for other preprocedural examination: Secondary | ICD-10-CM

## 2014-11-21 MED ORDER — CILOSTAZOL 100 MG PO TABS: 100.0000 mg | ORAL_TABLET | Freq: Two times a day (BID) | ORAL | Status: DC

## 2014-11-21 NOTE — Progress Notes (Signed)
New Intermittent Claudication  Referred by:  Abundio Miu, MD [address] [place], [place] [address]  Reason for referral: intermittent claudication  History of Present Illness  Sundeep Cary is a 73 y.o. (02/19/1941) female who presents with chief complaint: left leg pain.  Onset of symptom occurred two years ago and have been unchanged. She describes a "burning pain" starting at left hip that travels laterally down to below her knee. This pain is associated with climbing stairs or with walking "across the store and back." The pain is better with rest. She denies any calf claudication. She denies any rest pain. She has a history of post-prandial pain starting in June 2015. She describes not wanting to eat due to pain and has lost 20 pounds since June. She has been seeing gastroenterologist Dr. Darrick Penna and has since been started on protonix. She denies any further issues with post-prandial pain or food fear. She had a CT angiogram of the abdomen and pelvis performed on 09/30/14 and was referred to the [facility] office by radiologist, Dr. Lowella Dandy.  Atherosclerotic risk factors include: current smoker. She does not have a PCP. She does not know if she has hypercholesterolemia or hypertension. She does not have diabetes. She has no prior history of CAD.   She denies any TIA or stroke symptoms. She denies rest pain and non-healing wounds of the lower extremities. She has constipation.   Past Medical History  Diagnosis Date  . Chronic constipation   . GERD (gastroesophageal reflux disease)     Past Surgical History  Procedure Laterality Date  . Appendectomy    . Abdominal hysterectomy    . Esophagogastroduodenoscopy N/A 09/06/2014    Procedure: ESOPHAGOGASTRODUODENOSCOPY (EGD);  Surgeon: West Bali, MD;  Location: [facility];  Service: Endoscopy;  Laterality: N/A;  10:00    History   Social History  . Marital Status: Married    Spouse Name: N/A    Number of Children: 2  . Years  of Education: N/A   Occupational History  .     Social History Main Topics  . Smoking status: Current Every Day Smoker -- 0.25 packs/day for 25 years    Types: Cigarettes  . Smokeless tobacco: Not on file  . Alcohol Use: No  . Drug Use: No  . Sexual Activity: Yes   Other Topics Concern  . Not on file   Social History Narrative    Family History  Problem Relation Age of Onset  . Colon cancer Neg Hx   . Liver disease Neg Hx     Current Outpatient Prescriptions on File Prior to Visit  Medication Sig Dispense Refill  . HYDROcodone-acetaminophen (NORCO/VICODIN) 5-325 MG per tablet Take 1 tablet by mouth every 4 (four) hours as needed for moderate pain or severe pain. 10 tablet 0  . pantoprazole (PROTONIX) 40 MG tablet Take 1 tablet (40 mg total) by mouth daily before breakfast. 30 tablet 11  . varenicline (CHANTIX) 0.5 MG tablet 1 PO QD FOR 3 DAYS THEN 1 PO BID FOR 4 DAYS THEN 2 PO BID FOR 12 WEEKS. IT MAY CAUSE DEPRESSION, NAUSEA, OR SUICIDAL THOUGHTS. (Patient not taking: Reported on 11/21/2014) 120 tablet 2   No current facility-administered medications on file prior to visit.    Allergies  Allergen Reactions  . Linzess [Linaclotide]     ABDOMINAL CRAMPS AND BLOATING  . Sucralfate Other (See Comments)    Stomach Pain  . Sulfa Antibiotics Other (See Comments)  Stomach Pain    REVIEW OF SYSTEMS:  (Positives checked otherwise negative)  CARDIOVASCULAR:  [ ]  chest pain, [ ]  chest pressure, [ ]  palpitations, [ ]  shortness of breath when laying flat, [ ]  shortness of breath with exertion,   [ x] pain in left leg when walking, [ ]  pain in feet when laying flat, [ ]  history of blood clot in veins (DVT), [ ]  history of phlebitis, [ ]  swelling in legs, [ ]  varicose veins  PULMONARY:  [ ]  productive cough, [ ]  asthma, [ ]  wheezing  NEUROLOGIC:  [ ]  weakness in arms or legs, [ ]  numbness in arms or legs, [ ]  difficulty speaking or slurred speech, [ ]  temporary loss of vision in  one eye, [ ]  dizziness  HEMATOLOGIC:  [ ]  bleeding problems, [ ]  problems with blood clotting too easily  MUSCULOSKEL:  [ ]  joint pain, [ ]  joint swelling  GASTROINTEST:  [ ]   Vomiting blood, [ ]   Blood in stool     GENITOURINARY:  [ ]   Burning with urination, [ ]   Blood in urine  PSYCHIATRIC:  [ ]  history of major depression  INTEGUMENTARY:  [ ]  rashes, [ ]  ulcers  CONSTITUTIONAL:  [ ]  fever, [ ]  chills  For VQI Use Only  PRE-ADM LIVING: Home  AMB STATUS: Ambulatory  CAD Sx: None  PRIOR CHF: None  STRESS TEST: [ ]  No, [ ]  Normal, [ ]  + ischemia, [ ]  + MI, [ ]  Both   Physical Examination Filed Vitals:   11/21/14 1406  BP: 166/78  Pulse: 70  Temp: 98.1 F (36.7 C)  TempSrc: Oral  Resp: 16  Height: 5\' 7"  (1.702 m)  Weight: 124 lb (56.246 kg)  SpO2: 98%   Body mass index is 19.42 kg/(m^2).  General: A&O x 3, WDWN female in NAD  Head: [place]/AT  Neck: Supple, no nuchal rigidity, no palpable LAD  Pulmonary: Sym exp, good air movt, CTAB, no rales, rhonchi, & wheezing  Cardiac: RRR, Nl S1, S2, no Murmurs, rubs or gallops  Vascular: Vessel Right Left  Radial Palpable Palpable  Carotid Palpable, without bruit Palpable, without bruit  Aorta Palpable, -AAA N/A  Femoral Palpable Palpable  Popliteal Not palpable Not palpable  PT Palpable Not palpable  DP Palpable Not palpable   Gastrointestinal: soft, NTND, - HSM, - masses, palpable aorta  Musculoskeletal: M/S 5/5 throughout. Extremities without ischemic changes.   Neurologic: CN 2-12 intact. Pain and light touch intact in extremities. Motor exam as listed above  Psychiatric: Judgment intact, Mood & affect appropriatefor pt's clinical situation  Dermatologic: See M/S exam for extremity exam, no rashes otherwise noted   Non-Invasive Vascular Imaging  ABI (Date: 11/21/2014)  R: 1.02,  AT: biphasic,  PT: triphasic, TBI: 0.60  L: 0.50, AT: monophasic, PT: monophasic, TBI: 0.35  Carotid duplex (Date:  11/21/14)  <40% ICA stenosis bilaterally.   CT Angio Abd/Pelvis (09/30/14)  Chronic occlusion of the proximal SMA with significant stenosis at the origin of the IMA. Significant stenosis of the of common iliac arteries bilaterally. Significant stenosis at the origin of the right external iliac artery and narrowing of the left common femoral artery. Distal aorta with estimated 50% narrowing.    Medical Decision Making  Lenon OmsCarolyn Sue Dipasquale is a 73 y.o. female who presents with asymptomatic chronic mesenteric ischemia, aortoiliac occlusive disease and non-lifestyle limiting claudication of her left lower extremity.    She is no longer having post-prandial pain and is  able to tolerate a regular diet. Her weight loss and pain have improved since starting protonix.   She is able to tolerate her claudication symptoms and would like to defer any surgical intervention at this time. She will be started on cilostazol.   If her abdominal symptoms or lower extremity symptoms progress, she will need an aortobifemoral bypass with reimplantation of the inferior mesenteric artery.   Discussed in depth the need for maximal medical management through strict control of blood pressure and cholesterol, daily ASA and cessation of smoking.   The patient is aware that without maximal medical management the underlying atherosclerotic disease process will progress, limiting the benefit of any interventions.  The patient does not have a current PCP. She will establish care with her husband's PCP, Dr. Paulina FusiStephen McIntyre in [place].   She is not currently on a statin or antihypertensive medication. This will be determined by her PCP.   She takes a daily 81 mg aspirin.   She will follow up in 3 months. She was advised that if she had any further post-prandial or worsening claudication symptoms to follow up sooner.   Maris BergerKimberly Trinh, PA-C [facility] [place] Office: [phone] Pager:  [phone]  11/21/2014, 3:17 PM  I agree with the above.  I have seen and examined the patient.  She has reported a significant weight loss as well as postprandial abdominal pain.  CT angiogram shows occlusion of the superior mesenteric artery with reconstitution in the distal superior mesenteric artery.  There is also the possibility of stenosis at the origin of her celiac as well as the inferior mesenteric artery.  The patient has been recently treated for reflux disease and she states that her abdominal pain has resolved and that she can eat whatever she wants although she does get early satiety, she does not get postprandial pain.  The patient does endorse left leg claudication.  This does not appear to be significantly lifestyle limiting for her.  Her CT scan shows aortoiliac disease.  I have stressed the importance of medical management to treat her current peripheral vascular disease.  This includes no smoking, cholesterol management with a statin, blood pressure control and antiplatelet therapy.  I have given her a prescription for cilostazol to see if this helps alleviate any of her lower extremity symptoms.  I'm going to have her follow-up in 3 months to see if she has had any improvement in her symptoms or if they have returned.  If she does endorse postprandial abdominal pain, she will need formal angiography to define her anatomy to see what form of revascularization would be best.  I'm not sure given the disease in her superior mesenteric artery on CT scan that she is a candidate for bypass year she may just need improvement in blood flow to her inferior mesenteric artery and possibly a celiac bypass.  The patient does not have a primary medical physician, she has recommended seeing same position her [place].  I will try to facilitate that so that we can get her on statin therapy as well as better blood pressure control.  Durene CalWells Lilyonna Steidle

## 2014-11-21 NOTE — Patient Instructions (Signed)
Please review the tobacco cessation information given to you today. It lists many hints that are useful in your effort to stop smoking. The [employer] [facility]essation contact phone # is [phone] These nurses and advisors offer lots of FREE information and aids to help you quit.    The [place] Quit Smoking line #  800-784-8669, they will also assist you with programs designed to help you stop smoking.    Kay Tarnesha Ulloa, RN [facility]   [phone] ext [phone] 

## 2014-11-22 ENCOUNTER — Telehealth: Payer: Self-pay | Admitting: Surgery

## 2014-11-22 NOTE — Telephone Encounter (Signed)
Spoke with patient. She will be going to Dr. Penni HomansSteven McIntyre office in [place] [phone] to have labs drawn on 01/11/15 8:30 am, Will see Dr. Valorie RooseveltS. McIntyre in the [place] office on 01/12/14 at 2:30 as new PCP. Office note sent.

## 2014-11-24 ENCOUNTER — Ambulatory Visit: Payer: Medicare Other | Admitting: Gastroenterology

## 2015-02-27 ENCOUNTER — Ambulatory Visit: Payer: Medicare Other | Admitting: Surgery

## 2015-03-13 ENCOUNTER — Ambulatory Visit: Payer: Self-pay | Admitting: Surgery

## 2015-05-08 ENCOUNTER — Other Ambulatory Visit: Payer: Self-pay | Admitting: *Deleted

## 2015-05-08 DIAGNOSIS — I739 Peripheral vascular disease, unspecified: Secondary | ICD-10-CM

## 2015-09-01 ENCOUNTER — Encounter: Payer: Self-pay | Admitting: Surgery

## 2015-09-04 ENCOUNTER — Ambulatory Visit (HOSPITAL_COMMUNITY)
Admission: RE | Admit: 2015-09-04 | Discharge: 2015-09-04 | Disposition: A | Discharge status: Home / Self Care | Payer: Medicare Other | Source: Ambulatory Visit | Attending: Surgery | Admitting: Surgery

## 2015-09-04 ENCOUNTER — Ambulatory Visit (INDEPENDENT_AMBULATORY_CARE_PROVIDER_SITE_OTHER)
Admission: RE | Admit: 2015-09-04 | Discharge: 2015-09-04 | Disposition: A | Discharge status: Home / Self Care | Payer: Medicare Other | Source: Ambulatory Visit | Attending: Surgery | Admitting: Surgery

## 2015-09-04 ENCOUNTER — Ambulatory Visit: Payer: Self-pay | Admitting: Surgery

## 2015-09-04 ENCOUNTER — Ambulatory Visit (INDEPENDENT_AMBULATORY_CARE_PROVIDER_SITE_OTHER): Payer: Medicare Other | Admitting: Surgery

## 2015-09-04 ENCOUNTER — Encounter: Payer: Self-pay | Admitting: Surgery

## 2015-09-04 VITALS — BP 197/81 | HR 72 | Temp 98.3°F | Resp 18 | Ht 67.0 in | Wt 116.2 lb

## 2015-09-04 DIAGNOSIS — I739 Peripheral vascular disease, unspecified: Secondary | ICD-10-CM

## 2015-09-04 DIAGNOSIS — E785 Hyperlipidemia, unspecified: Secondary | ICD-10-CM | POA: Insufficient documentation

## 2015-09-04 DIAGNOSIS — I70219 Atherosclerosis of native arteries of extremities with intermittent claudication, unspecified extremity: Secondary | ICD-10-CM

## 2015-09-04 NOTE — Progress Notes (Signed)
Patient name: Christy Mccarthy MRN: [id number] DOB: [birth year] Sex: female     Chief Complaint  Patient presents with  . Re-evaluation    9 month f/u     HISTORY OF PRESENT ILLNESS: The patient comes back today for follow-up of her peripheral vascular disease.  She initially began having postprandial pain in 2015.  This was associated with a 20 pound weight loss.  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  Her symptoms resolved with the addition of Protonix and she has been symptom-free ever sense.  She says she can eat whatever she wants and does not get any associated pain.  Her CT scan also identified aortoiliac occlusive disease.  At her last visit she was started on cilostazol.  She is back today stating that she does not have claudication symptoms she does occasionally get pain in her left foot which is where she had a fall and ankle injury.  Past Medical History  Diagnosis Date  . Chronic constipation   . GERD (gastroesophageal reflux disease)     Past Surgical History  Procedure Laterality Date  . Appendectomy    . Abdominal hysterectomy    . Esophagogastroduodenoscopy N/A 09/06/2014    Procedure: ESOPHAGOGASTRODUODENOSCOPY (EGD);  Surgeon: West Bali, MD;  Location[facility];  Service: Endoscopy;  Laterality: N/A;  10:00    Social History   Social History  . Marital Status: Married    Spouse Name: N/A  . Number of Children: 2  . Years of Education: N/A   Occupational History  .     Social History Main Topics  . Smoking status: Current Every Day Smoker -- 0.25 packs/day for 25 years    Types: Cigarettes  . Smokeless tobacco: Not on file  . Alcohol Use: No  . Drug Use: No  . Sexual Activity: Yes   Other Topics Concern  . Not on file   Social History Narrative    Family History  Problem Relation Age of Onset  . Colon cancer Neg Hx   . Liver disease  Neg Hx     Allergies as of 09/04/2015 - Review Complete 09/04/2015  Allergen Reaction Noted  . Linzess [linaclotide]  09/26/2014  . Sucralfate Other (See Comments) 08/21/2014  . Sulfa antibiotics Other (See Comments) 08/21/2014    Current Outpatient Prescriptions on File Prior to Visit  Medication Sig Dispense Refill  . cilostazol (PLETAL) 100 MG tablet Take 1 tablet (100 mg total) by mouth 2 (two) times daily before a meal. 60 tablet 11  . HYDROcodone-acetaminophen (NORCO/VICODIN) 5-325 MG per tablet Take 1 tablet by mouth every 4 (four) hours as needed for moderate pain or severe pain. 10 tablet 0  . pantoprazole (PROTONIX) 40 MG tablet Take 1 tablet (40 mg total) by mouth daily before breakfast. 30 tablet 11  . varenicline (CHANTIX) 0.5 MG tablet 1 PO QD FOR 3 DAYS THEN 1 PO BID FOR 4 DAYS THEN 2 PO BID FOR 12 WEEKS. IT MAY CAUSE DEPRESSION, NAUSEA, OR SUICIDAL THOUGHTS. (Patient not taking: Reported on 09/04/2015) 120 tablet 2   No current facility-administered medications on file prior to visit.     REVIEW OF SYSTEMS: Cardiovascular: No chest pain, chest pressure, palpitations, orthopnea, or dyspnea on exertion. No claudication or rest pain,  No history of DVT or phlebitis. Pulmonary: No productive cough, asthma or wheezing. Neurologic: No weakness, paresthesias, aphasia, or amaurosis.  No dizziness. Hematologic: No bleeding problems or clotting disorders. Musculoskeletal: No joint pain or joint swelling. Gastrointestinal: No blood in stool or hematemesis Genitourinary: No dysuria or hematuria. Psychiatric:: No history of major depression. Integumentary: No rashes or ulcers. Constitutional: No fever or chills.  PHYSICAL EXAMINATION:   Vital signs are  Filed Vitals:   09/04/15 1206  BP: 197/81  Pulse: 72  Temp: 98.3 F (36.8 C)  TempSrc: Oral  Resp: 18  Height: 5\' 7"  (1.702 m)  Weight: 116 lb 3.2 oz (52.708 kg)  SpO2: 100%   Body mass index is 18.2 kg/(m^2). General:  The patient appears their stated age. HEENT:  No gross abnormalities Pulmonary:  Non labored breathing Abdomen: Soft and non-tender Musculoskeletal: There are no major deformities. Neurologic: No focal weakness or paresthesias are detected, Skin: There are no ulcer or rashes noted. Psychiatric: The patient has normal affect. Cardiovascular: There is a regular rate and rhythm without significant murmur appreciated.  Pedal pulses are not palpable   Diagnostic Studies None  Assessment: #1: Chronic mesenteric ischemia #2: Atherosclerosis with claudication Plan: #1: The patient remains asymptomatic.  Should she develop symptoms, she would potentially be a candidate for celiac artery stenting.  Based on the appearance of her superior mesenteric artery, she may have issues with mesenteric bypass, as the artery appears small and diseased well out from its origin.  Fortunately, she remains asymptomatic and therefore I will just repeat a mesenteric duplex in one year  #2: The patient's biggest complaint is pain on her foot which is associated with a trauma from 2 years ago.  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  I do not think this is a claudication symptom.  I have given her a prescription for Neurontin to see if this helps at all.  I will repeat a duplex with ABIs in one year.  The patient was instructed to contact me should she develop worsening symptoms or nonhealing wounds.  I reviewed her most recent laboratory values.  Her HDL is 81.  Her LDL is 87.  Her creatinine is 0.72  V. Charlena Cross, M.D. [facility] Office: [phone] Pager:  [phone]

## 2015-09-05 NOTE — Addendum Note (Signed)
Addended by: Adria Dill L on: 09/05/2015 12:00 PM   Modules accepted: Orders

## 2015-12-20 ENCOUNTER — Other Ambulatory Visit: Payer: Self-pay | Admitting: *Deleted

## 2015-12-20 DIAGNOSIS — I739 Peripheral vascular disease, unspecified: Secondary | ICD-10-CM

## 2015-12-20 MED ORDER — CILOSTAZOL 100 MG PO TABS: 100.0000 mg | ORAL_TABLET | Freq: Two times a day (BID) | ORAL | Status: AC

## 2016-09-05 ENCOUNTER — Encounter: Payer: Self-pay | Admitting: Surgery

## 2016-09-09 ENCOUNTER — Ambulatory Visit (INDEPENDENT_AMBULATORY_CARE_PROVIDER_SITE_OTHER)
Admission: RE | Admit: 2016-09-09 | Discharge: 2016-09-09 | Disposition: A | Discharge status: Home / Self Care | Payer: Medicare Other | Source: Ambulatory Visit | Attending: Surgery | Admitting: Surgery

## 2016-09-09 ENCOUNTER — Ambulatory Visit (INDEPENDENT_AMBULATORY_CARE_PROVIDER_SITE_OTHER): Payer: Medicare Other | Admitting: Family

## 2016-09-09 ENCOUNTER — Encounter: Payer: Self-pay | Admitting: Surgery

## 2016-09-09 ENCOUNTER — Ambulatory Visit (HOSPITAL_COMMUNITY)
Admission: RE | Admit: 2016-09-09 | Discharge: 2016-09-09 | Disposition: A | Discharge status: Home / Self Care | Payer: Medicare Other | Source: Ambulatory Visit | Attending: Surgery | Admitting: Surgery

## 2016-09-09 VITALS — BP 175/90 | HR 86 | Temp 97.4°F | Resp 16 | Ht 67.0 in | Wt 116.0 lb

## 2016-09-09 DIAGNOSIS — I70219 Atherosclerosis of native arteries of extremities with intermittent claudication, unspecified extremity: Secondary | ICD-10-CM | POA: Insufficient documentation

## 2016-09-09 DIAGNOSIS — I774 Celiac artery compression syndrome: Secondary | ICD-10-CM

## 2016-09-09 DIAGNOSIS — K55069 Acute infarction of intestine, part and extent unspecified: Secondary | ICD-10-CM | POA: Diagnosis not present

## 2016-09-09 DIAGNOSIS — Z720 Tobacco use: Secondary | ICD-10-CM

## 2016-09-09 DIAGNOSIS — R030 Elevated blood-pressure reading, without diagnosis of hypertension: Secondary | ICD-10-CM

## 2016-09-09 DIAGNOSIS — I771 Stricture of artery: Secondary | ICD-10-CM

## 2016-09-09 DIAGNOSIS — I779 Disorder of arteries and arterioles, unspecified: Secondary | ICD-10-CM

## 2016-09-09 DIAGNOSIS — I700 Atherosclerosis of aorta: Secondary | ICD-10-CM | POA: Insufficient documentation

## 2016-09-09 DIAGNOSIS — I70202 Unspecified atherosclerosis of native arteries of extremities, left leg: Secondary | ICD-10-CM | POA: Insufficient documentation

## 2016-09-09 DIAGNOSIS — IMO0001 Reserved for inherently not codable concepts without codable children: Secondary | ICD-10-CM

## 2016-09-09 DIAGNOSIS — I6523 Occlusion and stenosis of bilateral carotid arteries: Secondary | ICD-10-CM | POA: Diagnosis not present

## 2016-09-09 DIAGNOSIS — F17200 Nicotine dependence, unspecified, uncomplicated: Secondary | ICD-10-CM

## 2016-09-09 NOTE — Progress Notes (Signed)
4   CC: Follow up Mesenteric Ischemia/PAD/extracranial carotid artery stenosis  History of Present Illness  Christy Mccarthy is a 75 y.o. (06-05-1941) female patient of Dr. Myra Gianotti returns for follow-up of her peripheral vascular disease.  She initially began having postprandial pain in 2015.  This was associated with a 20 pound weight loss.  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  Her symptoms resolved with the addition of Protonix and she has been symptom-free ever sense.  She says she can eat whatever she wants and does not get any associated pain.  Her CT scan also identified aortoiliac occlusive disease.  At her visit in November 2015 she was started on cilostazol. By her Se[ptember 2016 visit she did not have claudication symptoms. She did occasionally get pain in her left foot which is where she had a fall and ankle injury.  Dr.Brabham last saw pt on 09/04/15. At that time the patient remained asymptomatic of mesenteric ischemia.  Should she develop symptoms, she would potentially be a candidate for celiac artery stenting.  Based on the appearance of her superior mesenteric artery, she may have issues with mesenteric bypass, as the artery appears small and diseased well out from its origin.  Fortunately, she remained asymptomatic and therefore I will just repeat a mesenteric duplex in one year In regards to lower extremity symptoms, the patient's biggest complaint was pain in her foot which is associated with a trauma from 2014.  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  Dr. Myra Gianotti did not think this was a claudication symptom. He gave her a prescription for Neurontin at that visit to see if that helped. She was to return with a repeat duplex with ABIs in one year and contact our office should she develop worsening symptoms or nonhealing wounds. At her September 2016  visit, her most recent laboratory values were as follows:  Her HDL: 81, LDL: 87, creatinine: 0.72.  She denies any known history of stroke or TIA.   Pt Diabetic: no Pt smoker: smoker  (1/4 ppd, started about age 60 yrs); she states she is experiencing severe family stress  Pt meds include: Statin : yes ASA: yes Other anticoagulants/antiplatelets: Pletal   Past Medical History:  Diagnosis Date  . Chronic constipation   . GERD (gastroesophageal reflux disease)     Social History Social History  Substance Use Topics  . Smoking status: Current Every Day Smoker    Packs/day: 0.25    Years: 25.00    Types: Cigarettes  . Smokeless tobacco: Never Used     Comment: 1/4 pk per day.   . Alcohol use No    Family History Family History  Problem Relation Age of Onset  . Colon cancer Neg Hx   . Liver disease Neg Hx     Surgical History Past Surgical History:  Procedure Laterality Date  . ABDOMINAL HYSTERECTOMY    . APPENDECTOMY    . ESOPHAGOGASTRODUODENOSCOPY N/A 09/06/2014   Procedure: ESOPHAGOGASTRODUODENOSCOPY (EGD);  Surgeon: West Bali, MD;  Location: [facility];  Service: Endoscopy;  Laterality: N/A;  10:00    Allergies  Allergen Reactions  . Neurontin [Gabapentin] Anaphylaxis  . Linzess [Linaclotide]     ABDOMINAL CRAMPS AND BLOATING  . Sucralfate Other (See Comments)    Stomach Pain  . Sulfa Antibiotics Other (See Comments)    Stomach Pain    Current Outpatient Prescriptions  Medication Sig Dispense Refill  . ALPRAZolam (XANAX) 0.25 MG tablet Take 0.25 mg by mouth at bedtime as needed for anxiety.    Marland Kitchen. aspirin EC 81 MG tablet Take 81 mg by mouth daily.    Marland Kitchen. atorvastatin (LIPITOR) 10 MG tablet Take 10 mg by mouth daily.    . cilostazol (PLETAL) 100 MG tablet Take 1 tablet (100 mg total) by mouth 2 (two) times daily before a meal. 60 tablet 11  . Multiple Vitamins-Minerals (CENTRUM SILVER PO) Take by mouth.    . pantoprazole (PROTONIX) 40 MG tablet Take 1  tablet (40 mg total) by mouth daily before breakfast. 30 tablet 11  . HYDROcodone-acetaminophen (NORCO/VICODIN) 5-325 MG per tablet Take 1 tablet by mouth every 4 (four) hours as needed for moderate pain or severe pain. (Patient not taking: Reported on 09/09/2016) 10 tablet 0  . varenicline (CHANTIX) 0.5 MG tablet 1 PO QD FOR 3 DAYS THEN 1 PO BID FOR 4 DAYS THEN 2 PO BID FOR 12 WEEKS. IT MAY CAUSE DEPRESSION, NAUSEA, OR SUICIDAL THOUGHTS. (Patient not taking: Reported on 09/09/2016) 120 tablet 2   No current facility-administered medications for this visit.     ROS: see HPI for pertinent positives and negatives    Physical Examination  Vitals:   09/09/16 1025 09/09/16 1026  BP: (!) 179/92 (!) 175/90  Pulse: 86   Resp: 16   Temp: 97.4 F (36.3 C)   TempSrc: Oral   SpO2: 98%   Weight: 116 lb (52.6 kg)   Height: 5\' 7"  (1.702 m)    Body mass index is 18.17 kg/m.  General: A&O x 3, WDWN, thin female  Pulmonary: Sym exp, respirations are non labored, good air movt, CTAB, no rales, rhonchi, or wheezing.  Cardiac: RRR, Nl S1, S2, no detected murmur.  Vascular: Vessel Right Left  Radial 2+Palpable 2+Palpable  Carotid  with bruit  without bruit  Aorta mildly palpable N/A  Femoral 2+Palpable 1+Palpable  Popliteal Not palpable Not palpable  PT Not Palpable Not Palpable  DP Not Palpable Not Palpable   Gastrointestinal: soft, NTND, -G/R, - HSM, - palpable masses, - CVAT.  Musculoskeletal: M/S 5/5 throughout, Extremities without ischemic changes.  Neurologic: Pain and light touch intact in extremities, Motor exam as listed above. CN 2-12 intact.     Medical Decision Making  Christy Mccarthy is a 75 y.o. female who presents with: asymptomatic chronic mesenteric ischemia, known SMA occlsuion. She has known PAOD, possible mild claudication in left LE vs reflex sympathetic dystrophy secondary to ankle injury in 2014, no issues in right LE.  DATA Right carotid bruit: November  2015 carotid duplex suggested <40% bilateral ICA stenosis with no history of stroke or TIA.   Today's mesenteric duplex demonstrates calcifications in the distal aorta which prevents thorough evaluation. The SMA has a known occlusion, unable to differentiate collaterals in the distal segment. Probable >70% stenosis of the celiac artery, limited visualization.  Today's bilateral LE arterial duplex demonstrates mixed plaque bilaterally. The right LE appears with no focal stenosis. 75-99% left CFA stenosis, calcific plaque may obscure higher velocity.  Increased velocity in the left CFA, with highest velocity of 395 cm/s.  ABI's: right LE with 95% (90% on 09/04/15) arterial perfusion with triphasic waveforms, left LE with 58% (52% on 09/04/15) with monophasic waveforms.  Slightly improved in both LE's.   She denies headache, dizziness, dyspnea, or chest pain.  I advised her to call her PCP's office as soon as possible to seek  advice re her elevated blood pressure.  The patient was counseled re smoking cessation and given several free resources re smoking cessation.  Face to face time with patient was 25 minutes. Over 50% of this time was spent on counseling and coordination of care.     Based on her exam and studies, I have offered the patient: left LE arterial duplex, bilateral ABI's, and mesenteric duplex in 6 months.  She knows to return sooner if needed.  I discussed in depth with the patient the nature of atherosclerosis, and emphasized the importance of maximal medical management including strict control of blood pressure, blood glucose, and lipid levels, obtaining regular exercise, and cessation of smoking.    The patient is aware that without maximal medical management the underlying atherosclerotic disease process will progress, limiting the benefit of any interventions. The patient is currently on a statin: atorvastatin 10 mg PO daily.   The patient is currently on an  anti-platelet: ASA 81 MG PO daily.    Thank you for allowing [place] to participate in this patient's care.  Charisse March, RN, MSN, FNP-C [facility] [place] Office: [phone]  Clinic MD: Myra Gianotti  09/09/2016, 10:33 AM

## 2016-09-09 NOTE — Patient Instructions (Addendum)
Peripheral Vascular Disease Peripheral vascular disease (PVD) is a disease of the blood vessels that are not part of your heart and brain. A simple term for PVD is poor circulation. In most cases, PVD narrows the blood vessels that carry blood from your heart to the rest of your body. This can result in a decreased supply of blood to your arms, legs, and internal organs, like your stomach or kidneys. However, it most often affects a person's lower legs and feet. There are two types of PVD.  Organic PVD. This is the more common type. It is caused by damage to the structure of blood vessels.  Functional PVD. This is caused by conditions that make blood vessels contract and tighten (spasm). Without treatment, PVD tends to get worse over time. PVD can also lead to acute ischemic limb. This is when an arm or limb suddenly has trouble getting enough blood. This is a medical emergency. CAUSES Each type of PVD has many different causes. The most common cause of PVD is buildup of a fatty material (plaque) inside of your arteries (atherosclerosis). Small amounts of plaque can break off from the walls of the blood vessels and become lodged in a smaller artery. This blocks blood flow and can cause acute ischemic limb. Other common causes of PVD include:  Blood clots that form inside of blood vessels.  Injuries to blood vessels.  Diseases that cause inflammation of blood vessels or cause blood vessel spasms.  Health behaviors and health history that increase your risk of developing PVD. RISK FACTORS  You may have a greater risk of PVD if you:  Have a family history of PVD.  Have certain medical conditions, including:  High cholesterol.  Diabetes.  High blood pressure (hypertension).  Coronary heart disease.  Past problems with blood clots.  Past injury, such as burns or a broken bone. These may have damaged blood vessels in your limbs.  Buerger disease. This is caused by inflamed blood  vessels in your hands and feet.  Some forms of arthritis.  Rare birth defects that affect the arteries in your legs.  Use tobacco.  Do not get enough exercise.  Are obese.  Are age 50 or older. SIGNS AND SYMPTOMS  PVD may cause many different symptoms. Your symptoms depend on what part of your body is not getting enough blood. Some common signs and symptoms include:  Cramps in your lower legs. This may be a symptom of poor leg circulation (claudication).  Pain and weakness in your legs while you are physically active that goes away when you rest (intermittent claudication).  Leg pain when at rest.  Leg numbness, tingling, or weakness.  Coldness in a leg or foot, especially when compared with the other leg.  Skin or hair changes. These can include:  Hair loss.  Shiny skin.  Pale or bluish skin.  Thick toenails.  Inability to get or maintain an erection (erectile dysfunction). People with PVD are more prone to developing ulcers and sores on their toes, feet, or legs. These may take longer than normal to heal. DIAGNOSIS Your health care provider may diagnose PVD from your signs and symptoms. The health care provider will also do a physical exam. You may have tests to find out what is causing your PVD and determine its severity. Tests may include:  Blood pressure recordings from your arms and legs and measurements of the strength of your pulses (pulse volume recordings).  Imaging studies using sound waves to take pictures of   the blood flow through your blood vessels (Doppler ultrasound).  Injecting a dye into your blood vessels before having imaging studies using:  X-rays (angiogram or arteriogram).  Computer-generated X-rays (CT angiogram).  A powerful electromagnetic field and a computer (magnetic resonance angiogram or MRA). TREATMENT Treatment for PVD depends on the cause of your condition and the severity of your symptoms. It also depends on your age. Underlying  causes need to be treated and controlled. These include long-lasting (chronic) conditions, such as diabetes, high cholesterol, and high blood pressure. You may need to first try making lifestyle changes and taking medicines. Surgery may be needed if these do not work. Lifestyle changes may include:  Quitting smoking.  Exercising regularly.  Following a low-fat, low-cholesterol diet. Medicines may include:  Blood thinners to prevent blood clots.  Medicines to improve blood flow.  Medicines to improve your blood cholesterol levels. Surgical procedures may include:  A procedure that uses an inflated balloon to open a blocked artery and improve blood flow (angioplasty).  A procedure to put in a tube (stent) to keep a blocked artery open (stent implant).  Surgery to reroute blood flow around a blocked artery (peripheral bypass surgery).  Surgery to remove dead tissue from an infected wound on the affected limb.  Amputation. This is surgical removal of the affected limb. This may be necessary in cases of acute ischemic limb that are not improved through medical or surgical treatments. HOME CARE INSTRUCTIONS  Take medicines only as directed by your health care provider.  Do not use any tobacco products, including cigarettes, chewing tobacco, or electronic cigarettes. If you need help quitting, ask your health care provider.  Lose weight if you are overweight, and maintain a healthy weight as directed by your health care provider.  Eat a diet that is low in fat and cholesterol. If you need help, ask your health care provider.  Exercise regularly. Ask your health care provider to suggest some good activities for you.  Use compression stockings or other mechanical devices as directed by your health care provider.  Take good care of your feet.  Wear comfortable shoes that fit well.  Check your feet often for any cuts or sores. SEEK MEDICAL CARE IF:  You have cramps in your legs  while walking.  You have leg pain when you are at rest.  You have coldness in a leg or foot.  Your skin changes.  You have erectile dysfunction.  You have cuts or sores on your feet that are not healing. SEEK IMMEDIATE MEDICAL CARE IF:  Your arm or leg turns cold and blue.  Your arms or legs become red, warm, swollen, painful, or numb.  You have chest pain or trouble breathing.  You suddenly have weakness in your face, arm, or leg.  You become very confused or lose the ability to speak.  You suddenly have a very bad headache or lose your vision.   This information is not intended to replace advice given to you by your health care provider. Make sure you discuss any questions you have with your health care provider.   Document Released: 01/16/2005 Document Revised: 12/30/2014 Document Reviewed: 05/19/2014 Elsevier Interactive Patient Education 2016 Elsevier Inc.    Chronic Mesenteric Ischemia Mesenteric ischemia is a deficiency of blood in an area of the intestine supplied by an artery that supports the intestine. Chronic mesenteric ischemia, also called intestinal angina, is a long-term condition. It happens when an artery or vein that supports the intestine gradually  becomes blocked or narrow, restricting the blood supply to the intestine. When the blood supply to the intestine is severely restricted, the intestines cannot function properly because needed oxygen cannot reach them.  CAUSES   Fatty deposits that build up in an artery or vein but have not yet restricted blood flow entirely.  Differences in some people's anatomy.  Rapid weight loss.  Weakened areas in blood vessel walls (aneurysms).  Swelling and inflammation of blood vessels (such as from fibromuscular dysplasia and arteritis).  Disorders of blood clotting.  Scarring and fibrosis of blood vessels after radiation therapy.  Blood vessel problems after drug use, such as use of cocaine. RISK  FACTORS  Being female.  Being over age 71 with a history of coronary or vascular disease.  Smoking.  Congestive heart failure.  Diabetes.  High cholesterol.  High blood pressure (hypertension). SIGNS AND SYMPTOMS   Severe stomachache. Some people become fearful of eating because of pain.   Abdominal pain or cramps that develop about 30 minutes after a meal.   Abdominal pain after eating that becomes worse over time.   Diarrhea.   Nausea.   Vomiting.   Bloating.   Weight loss. DIAGNOSIS  Chronic mesenteric ischemia is often diagnosed after the person's history is taken, a physical exam is done, and tests are taken. Tests may include:  Ultrasounds.  CT scans.  Angiography. This is an imaging test that uses a dye to obtain a picture of blood flow to the intestine.  Endoscopy. This involves putting a scope through the mouth, down the throat, and into the stomach and intestine to view the intestinal wall and take small tissue samples (biopsies).  Tonometry. In this test a tiny probe is passed through the mouth and into the stomach or intestine and left in place for 24 hours or more. It measures the output of carbon dioxide by the affected tissues. TREATMENT  Treatment may include:   Medicines to reduce blood clotting and increase blood flow.   Surgery to remove the blockage, repair arteries or veins, and restore blood flow. This may involve:   Angioplasty. This is surgery to widen the affected artery, reduce the blockage, and sometimes insert a small, mesh tube (stent).   Bypass surgery. This may be performed to bypass the blockage and reconnect healthy arteries or veins.   A stent in the affected area to help keep blocked arteries open. HOME CARE INSTRUCTIONS  Only take over-the-counter or prescription medicines as directed by your health care provider.   Keep all follow-up appointments as directed by your health care provider.   Prevent the  condition from occurring by:  Doing regular exercise.  Keeping a healthy weight.  Keeping a healthy diet.  Managing cholesterol levels.  Keeping blood pressure and heart rhythm problems under control.  Not smoking. SEEK IMMEDIATE MEDICAL CARE IF:  You have severe abdominal pain.   You notice blood in your stool.   You have nausea, vomiting, or diarrhea.   You have a fever. MAKE SURE YOU:  Understand these instructions.  Will watch your condition.  Will get help right away if you are not doing well or get worse.   This information is not intended to replace advice given to you by your health care provider. Make sure you discuss any questions you have with your health care provider.   Document Released: 07/29/2011 Document Revised: 08/11/2013 Document Reviewed: 06/09/2013 Elsevier Interactive Patient Education 2016 [employer].     Steps to [employer]  Smoking  Smoking tobacco can be harmful to your health and can affect almost every organ in your body. Smoking puts you, and those around you, at risk for developing many serious chronic diseases. Quitting smoking is difficult, but it is one of the best things that you can do for your health. It is never too late to quit. WHAT ARE THE BENEFITS OF QUITTING SMOKING? When you quit smoking, you lower your risk of developing serious diseases and conditions, such as:  Lung cancer or lung disease, such as COPD.  Heart disease.  Stroke.  Heart attack.  Infertility.  Osteoporosis and bone fractures. Additionally, symptoms such as coughing, wheezing, and shortness of breath may get better when you quit. You may also find that you get sick less often because your body is stronger at fighting off colds and infections. If you are pregnant, quitting smoking can help to reduce your chances of having a baby of low birth weight. HOW DO I GET READY TO QUIT? When you decide to quit smoking, create a plan to make sure that you are  successful. Before you quit:  Pick a date to quit. Set a date within the next two weeks to give you time to prepare.  Write down the reasons why you are quitting. Keep this list in places where you will see it often, such as on your bathroom mirror or in your car or wallet.  Identify the people, places, things, and activities that make you want to smoke (triggers) and avoid them. Make sure to take these actions:  Throw away all cigarettes at home, at work, and in your car.  Throw away smoking accessories, such as Set designer.  Clean your car and make sure to empty the ashtray.  Clean your home, including curtains and carpets.  Tell your family, friends, and coworkers that you are quitting. Support from your loved ones can make quitting easier.  Talk with your health care provider about your options for quitting smoking.  Find out what treatment options are covered by your health insurance. WHAT STRATEGIES CAN I USE TO QUIT SMOKING?  Talk with your healthcare provider about different strategies to quit smoking. Some strategies include:  Quitting smoking altogether instead of gradually lessening how much you smoke over a period of time. Research shows that quitting "cold [place]" is more successful than gradually quitting.  Attending in-person counseling to help you build problem-solving skills. You are more likely to have success in quitting if you attend several counseling sessions. Even short sessions of 10 minutes can be effective.  Finding resources and support systems that can help you to quit smoking and remain smoke-free after you quit. These resources are most helpful when you use them often. They can include:  Online chats with a Veterinary surgeon.  Telephone quitlines.  Printed Materials engineer.  Support groups or group counseling.  Text messaging programs.  Mobile phone applications.  Taking medicines to help you quit smoking. (If you are pregnant or  breastfeeding, talk with your health care provider first.) Some medicines contain nicotine and some do not. Both types of medicines help with cravings, but the medicines that include nicotine help to relieve withdrawal symptoms. Your health care provider may recommend:  Nicotine patches, gum, or lozenges.  Nicotine inhalers or sprays.  Non-nicotine medicine that is taken by mouth. Talk with your health care provider about combining strategies, such as taking medicines while you are also receiving in-person counseling. Using these two strategies together makes you more  likely to succeed in quitting than if you used either strategy on its own. If you are pregnant or breastfeeding, talk with your health care provider about finding counseling or other support strategies to quit smoking. Do not take medicine to help you quit smoking unless told to do so by your health care provider. WHAT THINGS CAN I DO TO MAKE IT EASIER TO QUIT? Quitting smoking might feel overwhelming at first, but there is a lot that you can do to make it easier. Take these important actions:  Reach out to your family and friends and ask that they support and encourage you during this time. Call telephone quitlines, reach out to support groups, or work with a counselor for support.  Ask people who smoke to avoid smoking around you.  Avoid places that trigger you to smoke, such as bars, parties, or smoke-break areas at work.  Spend time around people who do not smoke.  Lessen stress in your life, because stress can be a smoking trigger for some people. To lessen stress, try:  Exercising regularly.  Deep-breathing exercises.  Yoga.  Meditating.  Performing a body scan. This involves closing your eyes, scanning your body from head to toe, and noticing which parts of your body are particularly tense. Purposefully relax the muscles in those areas.  Download or purchase mobile phone or tablet apps (applications) that can help  you stick to your quit plan by providing reminders, tips, and encouragement. There are many free apps, such as QuitGuide from the [employer] developer for Disease Control and Prevention). You can find other support for quitting smoking (smoking cessation) through smokefree.gov and other websites. HOW WILL I FEEL WHEN I QUIT SMOKING? Within the first 24 hours of quitting smoking, you may start to feel some withdrawal symptoms. These symptoms are usually most noticeable 2-3 days after quitting, but they usually do not last beyond 2-3 weeks. Changes or symptoms that you might experience include:  Mood swings.  Restlessness, anxiety, or irritation.  Difficulty concentrating.  Dizziness.  Strong cravings for sugary foods in addition to nicotine.  Mild weight gain.  Constipation.  Nausea.  Coughing or a sore throat.  Changes in how your medicines work in your body.  A depressed mood.  Difficulty sleeping (insomnia). After the first 2-3 weeks of quitting, you may start to notice more positive results, such as:  Improved sense of smell and taste.  Decreased coughing and sore throat.  Slower heart rate.  Lower blood pressure.  Clearer skin.  The ability to breathe more easily.  Fewer sick days. Quitting smoking is very challenging for most people. Do not get discouraged if you are not successful the first time. Some people need to make many attempts to quit before they achieve long-term success. Do your best to stick to your quit plan, and talk with your health care provider if you have any questions or concerns.   This information is not intended to replace advice given to you by your health care provider. Make sure you discuss any questions you have with your health care provider.   Document Released: 12/03/2001 Document Revised: 04/25/2015 Document Reviewed: 04/25/2015 Elsevier Interactive Patient Education [employer]

## 2016-11-28 NOTE — Addendum Note (Signed)
Addended by: Burton ApleyPETTY, Aolani Piggott A on: 11/28/2016 08:39 AM   Modules accepted: Orders

## 2017-03-17 ENCOUNTER — Ambulatory Visit (HOSPITAL_COMMUNITY): Payer: Medicare Other

## 2017-03-17 ENCOUNTER — Encounter (HOSPITAL_COMMUNITY): Payer: Medicare Other

## 2017-03-17 ENCOUNTER — Ambulatory Visit: Payer: Medicare Other | Admitting: Surgery

## 2017-03-21 ENCOUNTER — Encounter (HOSPITAL_COMMUNITY): Payer: Medicare Other

## 2017-03-21 ENCOUNTER — Ambulatory Visit: Payer: Medicare Other | Admitting: Family

## 2018-04-24 ENCOUNTER — Other Ambulatory Visit: Payer: Self-pay

## 2018-04-24 DIAGNOSIS — I771 Stricture of artery: Secondary | ICD-10-CM

## 2018-04-24 DIAGNOSIS — I774 Celiac artery compression syndrome: Secondary | ICD-10-CM

## 2018-04-24 DIAGNOSIS — I779 Disorder of arteries and arterioles, unspecified: Secondary | ICD-10-CM

## 2018-04-24 DIAGNOSIS — K55069 Acute infarction of intestine, part and extent unspecified: Secondary | ICD-10-CM

## 2018-05-25 ENCOUNTER — Ambulatory Visit (INDEPENDENT_AMBULATORY_CARE_PROVIDER_SITE_OTHER): Payer: Medicare HMO | Admitting: Surgery

## 2018-05-25 ENCOUNTER — Encounter: Payer: Self-pay | Admitting: Surgery

## 2018-05-25 ENCOUNTER — Ambulatory Visit (INDEPENDENT_AMBULATORY_CARE_PROVIDER_SITE_OTHER)
Admission: RE | Admit: 2018-05-25 | Discharge: 2018-05-25 | Disposition: A | Discharge status: Home / Self Care | Payer: Medicare HMO | Source: Ambulatory Visit | Attending: Surgery | Admitting: Surgery

## 2018-05-25 ENCOUNTER — Ambulatory Visit (HOSPITAL_COMMUNITY)
Admission: RE | Admit: 2018-05-25 | Discharge: 2018-05-25 | Disposition: A | Discharge status: Home / Self Care | Payer: Medicare HMO | Source: Ambulatory Visit | Attending: Surgery | Admitting: Surgery

## 2018-05-25 VITALS — BP 183/85 | HR 79 | Temp 98.2°F | Resp 18 | Ht 67.0 in | Wt 122.1 lb

## 2018-05-25 DIAGNOSIS — I774 Celiac artery compression syndrome: Secondary | ICD-10-CM | POA: Insufficient documentation

## 2018-05-25 DIAGNOSIS — I779 Disorder of arteries and arterioles, unspecified: Secondary | ICD-10-CM

## 2018-05-25 DIAGNOSIS — K55069 Acute infarction of intestine, part and extent unspecified: Secondary | ICD-10-CM

## 2018-05-25 DIAGNOSIS — I739 Peripheral vascular disease, unspecified: Secondary | ICD-10-CM | POA: Diagnosis not present

## 2018-05-25 DIAGNOSIS — I771 Stricture of artery: Secondary | ICD-10-CM
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
## 2018-05-26 ENCOUNTER — Encounter: Payer: Self-pay | Admitting: Internal Medicine

## 2018-08-31 ENCOUNTER — Ambulatory Visit: Payer: Medicare HMO | Admitting: Surgery

## 2018-11-02 ENCOUNTER — Ambulatory Visit: Payer: Medicare HMO | Admitting: Surgery

## 2019-08-03 ENCOUNTER — Other Ambulatory Visit: Payer: Self-pay | Admitting: Orthopedic Surgery

## 2019-08-03 DIAGNOSIS — M259 Joint disorder, unspecified: Secondary | ICD-10-CM

## 2019-08-17 ENCOUNTER — Other Ambulatory Visit: Payer: Self-pay

## 2019-08-17 ENCOUNTER — Ambulatory Visit
Admission: RE | Admit: 2019-08-17 | Discharge: 2019-08-17 | Disposition: A | Discharge status: Home / Self Care | Payer: Medicare HMO | Source: Ambulatory Visit | Attending: Orthopedic Surgery | Admitting: Orthopedic Surgery

## 2019-08-17 DIAGNOSIS — M259 Joint disorder, unspecified: Secondary | ICD-10-CM

## 2019-11-08 ENCOUNTER — Telehealth: Payer: Self-pay | Admitting: Student in an Organized Health Care Education/Training Program

## 2019-11-08 ENCOUNTER — Encounter: Payer: Self-pay | Admitting: Student in an Organized Health Care Education/Training Program

## 2019-11-08 ENCOUNTER — Ambulatory Visit
Payer: Medicare HMO | Attending: Student in an Organized Health Care Education/Training Program | Admitting: Student in an Organized Health Care Education/Training Program

## 2019-11-08 ENCOUNTER — Other Ambulatory Visit: Payer: Self-pay

## 2019-11-08 VITALS — BP 193/69 | HR 51 | Temp 97.0°F | Ht 67.0 in | Wt 110.0 lb

## 2019-11-08 DIAGNOSIS — M5416 Radiculopathy, lumbar region: Secondary | ICD-10-CM | POA: Diagnosis present

## 2019-11-08 DIAGNOSIS — M47816 Spondylosis without myelopathy or radiculopathy, lumbar region: Secondary | ICD-10-CM | POA: Diagnosis present

## 2019-11-08 DIAGNOSIS — M5136 Other intervertebral disc degeneration, lumbar region: Secondary | ICD-10-CM | POA: Insufficient documentation

## 2019-11-08 DIAGNOSIS — G894 Chronic pain syndrome: Secondary | ICD-10-CM

## 2019-11-08 DIAGNOSIS — G8929 Other chronic pain: Secondary | ICD-10-CM | POA: Diagnosis present

## 2019-11-08 MED ORDER — DULOXETINE HCL 20 MG PO CPEP: 20.0000 mg | ORAL_CAPSULE | Freq: Every day | ORAL | 2 refills | Status: DC

## 2019-11-08 MED ORDER — PREGABALIN 25 MG PO CAPS: ORAL_CAPSULE | ORAL | 0 refills | Status: DC

## 2019-11-08 NOTE — Patient Instructions (Addendum)
____________________________________________________________________________________________  Preparing for your procedure (without sedation)  Procedure appointments are limited to planned procedures: . No Prescription Refills. . No disability issues will be discussed. . No medication changes will be discussed.  Instructions: . Oral Intake: Do not eat or drink anything for at least 3 hours prior to your procedure. . Transportation: Unless otherwise stated by your physician, you may drive yourself after the procedure. . Blood Pressure Medicine: Take your blood pressure medicine with a sip of water the morning of the procedure. . Blood thinners: Notify our staff if you are taking any blood thinners. Depending on which one you take, there will be specific instructions on how and when to stop it. . Diabetics on insulin: Notify the staff so that you can be scheduled 1st case in the morning. If your diabetes requires high dose insulin, take only  of your normal insulin dose the morning of the procedure and notify the staff that you have done so. . Preventing infections: Shower with an antibacterial soap the morning of your procedure.  . Build-up your immune system: Take 1000 mg of Vitamin C with every meal (3 times a day) the day prior to your procedure. . Antibiotics: Inform the staff if you have a condition or reason that requires you to take antibiotics before dental procedures. . Pregnancy: If you are pregnant, call and cancel the procedure. . Sickness: If you have a cold, fever, or any active infections, call and cancel the procedure. . Arrival: You must be in the facility at least 30 minutes prior to your scheduled procedure. . Children: Do not bring any children with you. . Dress appropriately: Bring dark clothing that you would not mind if they get stained. . Valuables: Do not bring any jewelry or valuables.  Reasons to call and reschedule or cancel your procedure: (Following these  recommendations will minimize the risk of a serious complication.) . Surgeries: Avoid having procedures within 2 weeks of any surgery. (Avoid for 2 weeks before or after any surgery). . Flu Shots: Avoid having procedures within 2 weeks of a flu shots or . (Avoid for 2 weeks before or after immunizations). . Barium: Avoid having a procedure within 7-10 days after having had a radiological study involving the use of radiological contrast. (Myelograms, Barium swallow or enema study). . Heart attacks: Avoid any elective procedures or surgeries for the initial 6 months after a "Myocardial Infarction" (Heart Attack). . Blood thinners: It is imperative that you stop these medications before procedures. Let us know if you if you take any blood thinner.  . Infection: Avoid procedures during or within two weeks of an infection (including chest colds or gastrointestinal problems). Symptoms associated with infections include: Localized redness, fever, chills, night sweats or profuse sweating, burning sensation when voiding, cough, congestion, stuffiness, runny nose, sore throat, diarrhea, nausea, vomiting, cold or Flu symptoms, recent or current infections. It is specially important if the infection is over the area that we intend to treat. . Heart and lung problems: Symptoms that may suggest an active cardiopulmonary problem include: cough, chest pain, breathing difficulties or shortness of breath, dizziness, ankle swelling, uncontrolled high or unusually low blood pressure, and/or palpitations. If you are experiencing any of these symptoms, cancel your procedure and contact your primary care physician for an evaluation.  Remember:  Regular Business hours are:  Monday to Thursday 8:00 AM to 4:00 PM  Provider's Schedule: Francisco Naveira, MD:  Procedure days: Tuesday and Thursday 7:30 AM to 4:00 PM  [place]   Holley Raring, MD:  Procedure days: Monday and Wednesday 7:30 AM to 4:00  PM ____________________________________________________________________________________________ Stop Cilastazole 3 days before the injection.

## 2019-11-08 NOTE — Telephone Encounter (Signed)
Patient called stating she is unable to get the gabapentin. Cost is $90 for 3 months. Is there anything like it that is lower cost. She has too many bills with her husbands cancer and diabetes. She  Is changing insurance.

## 2019-11-08 NOTE — Telephone Encounter (Signed)
She has tried and failed Gabapentin. That's why I rx'd Lyrica. If that it too expensive, she can try Cymbalta. Rx sent.

## 2019-11-08 NOTE — Progress Notes (Signed)
Safety precautions to be maintained throughout the outpatient stay will include: orient to surroundings, keep bed in low position, maintain call bell within reach at all times, provide assistance with transfer out of bed and ambulation.  

## 2019-11-08 NOTE — Telephone Encounter (Signed)
Please advise 

## 2019-11-08 NOTE — Progress Notes (Signed)
Patient's Name: Christy Mccarthy  MRN: [id number]  Referring Provider: Melina Schools, MD  DOB: [birth year]  PCP: [facility]  DOS: 11/08/2019  Note by: Gillis Santa, MD  Service setting: Ambulatory outpatient  Specialty: Interventional Pain Management  Location: [facility] (AMB) Pain Management Facility  Visit type: Initial Patient Evaluation  Patient type: New Patient   Primary Reason(s) for Visit: Encounter for initial evaluation of one or more chronic problems (new to examiner) potentially causing chronic pain, and posing a threat to normal musculoskeletal function. (Level of risk: High) CC: Hip Pain  HPI  Christy Mccarthy is a 78 y.o. year old, female patient, who comes today to see [place] for the first time for an initial evaluation of her chronic pain. 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 Today she comes in for evaluation of her Hip Pain  Pain Assessment: Location: Right, Left Hip Radiating: pain radiaties down both side but the right side is the worse Onset: More than a month ago Duration: Chronic pain Quality: Burning Severity: 2 /10 (subjective, self-reported pain score)  Note: Reported level is compatible with observation.                         When using our objective Pain Scale, levels between 6 and 10/10 are said to belong in an emergency room, as it progressively worsens from a 6/10, described as severely limiting, requiring emergency care not usually available at an outpatient pain management facility. At a 6/10 level, communication becomes difficult and requires great effort. Assistance to reach the emergency department may be  required. Facial flushing and profuse sweating along with potentially dangerous increases in heart rate and blood pressure will be evident. Effect on ADL: limits my daily actvities Timing: Intermittent Modifying factors: nothing BP: (!) 193/69  HR: (!) 51  Onset and Duration: Present longer than 3 months Cause of pain: Unknown Severity: Getting worse ranges from 4/10 to 10/10 Timing: Afternoon, Night and After activity or exercise Aggravating Factors: Bending, Motion and Walking Alleviating Factors: Hot packs Associated Problems: Night-time cramps, Fatigue, Numbness and Tingling Quality of Pain: Burning, Horrible and Sharp Previous Examinations or Tests: CT scan, MRI scan, X-rays and Orthopedic evaluation Previous Treatments: Physical Therapy and TENS  The patient comes into the clinics today for the first time for a chronic pain management evaluation.   Patient is a 78 year old female who presents with a chief complaint of right greater than left low back pain which radiates into her hips and her posterior lateral thigh in a dermatomal fashion.  This started approximately 2 years ago.  No inciting or traumatic event.  No falls or history of spine surgery.  She was managed previously in [place].  States that she had a right epidural steroid injection done approximately 1 year ago which was not helpful.  They also attempted a diagnostic sacroiliac joint injection which was not helpful.  She has tried physical therapy in the past which was not helpful.  She has tried gabapentin in the past which caused sedation and cognitive side effects.  She is on Mobic 15 mg daily.  She has tried baclofen in the past as well which was not very effective.  Patient describes a burning tingling  pain in a dermatomal fashion along her right posterior lateral thigh and leg.  Patient is on daily Xanax as needed for anxiety, will need to discontinue if we are to consider any sort of long-term opioid therapy.   Patient wants to avoid at this time and will focus on interventional pain management and not opioid-based pharmacotherapy.   Meds   Current Outpatient Medications:  .  ALPRAZolam (XANAX) 0.25 MG tablet, Take 0.25 mg by mouth at bedtime as needed for anxiety., Disp: , Rfl:  .  aspirin EC 81 MG tablet, Take 81 mg by mouth daily., Disp: , Rfl:  .  atorvastatin (LIPITOR) 10 MG tablet, Take 40 mg by mouth daily. , Disp: , Rfl:  .  cilostazol (PLETAL) 100 MG tablet, Take 1 tablet (100 mg total) by mouth 2 (two) times daily before a meal., Disp: 60 tablet, Rfl: 11 .  losartan (COZAAR) 50 MG tablet, Take 50 mg by mouth daily., Disp: , Rfl:  .  meloxicam (MOBIC) 15 MG tablet, Take 15 mg by mouth daily., Disp: , Rfl:  .  pantoprazole (PROTONIX) 40 MG tablet, Take 1 tablet (40 mg total) by mouth daily before breakfast., Disp: 30 tablet, Rfl: 11 .  Multiple Vitamins-Minerals (CENTRUM SILVER PO), Take by mouth., Disp: , Rfl:  .  pregabalin (LYRICA) 25 MG capsule, Take 1 capsule (25 mg total) by mouth at bedtime for 21 days, THEN 1 capsule (25 mg total) 2 (two) times daily., Disp: 99 capsule, Rfl: 0 .  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 Specific risks of the procedure included nondiagnostic/nontherapeutic injection and non target injection. The patient was placed prone on the CT table and localization was performed over the sacrum. Target site marked using CT guidance.  The skin was prepped and draped in the usual sterile fashion using Betadine soap.  After local anesthesia with 1% lidocaine without epinephrine and subsequent deep anesthesia, a 3.5 inch 22 gauge spinal needle was directed towards the right SI joint under intermittent CT guidance. Joint access was extremely difficult due to large osteophytes along nearly the entirety of the joint posteriorly. The needle was exchanged for a 1.5 inch 21 gauge needle which was eventually successfully placed into the inferior aspect of the joint. Despite the tip of the needle clearly being well within the joint, injection of a small amount of Isovue-M 200 demonstrated preferential contrast spread into the soft tissues posterior to the joint. The needle tip was repositioned twice in the joint with additional contrast again spreading posterior to the joint. Limited joint access precluded more substantial needle repositioning into a different portion of the joint. The final set of CT images demonstrated the needle tip to be well within the joint, approximately 4 mm superior to the inferior most aspect of the joint (series 20, image 18). It was at this location that 2 mL of 0.5% bupivacaine were injected. The needle was removed and a sterile dressing applied.  No complications were observed.  IMPRESSION:  CT-guided right SI joint injection of anesthetic only. Technically difficult joint access with preferential contrast spread posterior to the joint as detailed above.   Electronically Signed   By: Logan Bores M.D.   On: 08/17/2019 17:09     Complexity Note: Imaging results reviewed. Results shared with Ms. Claude, using Layman's terms.                         ROS  Cardiovascular: No reported cardiovascular signs or symptoms such as High blood pressure, coronary artery disease, abnormal heart rate or rhythm, heart attack, blood thinner therapy or heart weakness and/or failure Pulmonary or  Respiratory: No reported pulmonary signs or symptoms such as wheezing and difficulty taking a deep full breath (Asthma), difficulty blowing air out (Emphysema), coughing up mucus (Bronchitis), persistent dry cough, or temporary stoppage of breathing during sleep Neurological: No reported neurological signs or symptoms such as seizures, abnormal skin sensations, urinary and/or fecal incontinence, being born with an abnormal open spine and/or a tethered spinal cord Review of Past Neurological Studies: No results found for this or any previous visit. Psychological-Psychiatric: No reported psychological or psychiatric signs or symptoms such as difficulty sleeping, anxiety, depression, delusions or hallucinations (schizophrenial), mood swings (bipolar disorders) or suicidal ideations or attempts Gastrointestinal: No reported gastrointestinal signs or symptoms such as vomiting or evacuating blood, reflux, heartburn, alternating episodes of diarrhea and constipation, inflamed or scarred liver, or pancreas or irrregular and/or infrequent bowel movements Genitourinary: No reported renal or genitourinary signs or symptoms such as difficulty voiding or producing urine, peeing blood, non-functioning kidney, kidney stones, difficulty emptying the bladder, difficulty controlling the flow of urine, or chronic kidney disease Hematological: No reported hematological signs or symptoms such as prolonged bleeding, low or poor functioning platelets, bruising or bleeding easily, hereditary bleeding problems, low energy levels due to low hemoglobin or being anemic Endocrine: No reported endocrine signs or symptoms such as high or low blood sugar, rapid heart rate due to high thyroid levels, obesity or weight gain due to slow thyroid or thyroid disease Rheumatologic: No reported rheumatological signs and symptoms such as fatigue, joint pain, tenderness, swelling, redness, heat, stiffness, decreased range of motion, with or without  associated rash Musculoskeletal: Negative for myasthenia gravis, muscular dystrophy, multiple sclerosis or malignant hyperthermia Work History: Unemployed  Allergies  Ms. Delatorre is allergic to neurontin [gabapentin]; linzess [linaclotide]; sucralfate; and sulfa antibiotics.  Laboratory Chemistry Profile   Screening No results found for: SARSCOV2NAA, COVIDSOURCE, STAPHAUREUS, MRSAPCR, HCVAB, HIV, PREGTESTUR  Inflammation (CRP: Acute Phase) (ESR: Chronic Phase) No results found for: CRP, ESRSEDRATE, LATICACIDVEN                       Rheumatology No results found for: RF, ANA, LABURIC, URICUR, LYMEIGGIGMAB, LYMEABIGMQN, HLAB27                      Renal Lab Results  Component Value Date   BUN 19 08/21/2014   CREATININE 0.66 08/21/2014   GFRAA >90 08/21/2014   GFRNONAA 84 (L) 08/21/2014                             Hepatic Lab Results  Component Value Date   AST 21 08/21/2014   ALT 12 08/21/2014   ALBUMIN 3.5 08/21/2014   ALKPHOS 83 08/21/2014   LIPASE 67 (L) 08/17/2014  Electrolytes Lab Results  Component Value Date   NA 140 08/21/2014   K 3.5 (L) 08/21/2014   CL 98 08/21/2014   CALCIUM 9.7 08/21/2014                         Coagulation Lab Results  Component Value Date   PLT 268 08/21/2014                        Cardiovascular Lab Results  Component Value Date   TROPONINI < 0.02 08/17/2014   HGB 12.9 08/21/2014   HCT 39.3 08/21/2014                          Note: Lab results reviewed.  [place]  Drug: Ms. Wimer  reports no history of drug use. Alcohol:  reports no history of alcohol use. Tobacco:  reports that she has been smoking cigarettes. She has a 6.25 pack-year smoking history. She has never used smokeless tobacco. Medical:  has a past medical history of Chronic constipation and GERD (gastroesophageal reflux disease). Family: family history is not on file.  Past Surgical History:  Procedure Laterality Date  . ABDOMINAL  HYSTERECTOMY    . APPENDECTOMY    . ESOPHAGOGASTRODUODENOSCOPY N/A 09/06/2014   Procedure: ESOPHAGOGASTRODUODENOSCOPY (EGD);  Surgeon: Danie Binder, MD;  Location: [facility];  Service: Endoscopy;  Laterality: N/A;  10:00   Active Ambulatory Problems    Diagnosis Date Noted  . Abdominal pain, epigastric 09/02/2014  . Abnormal weight loss 09/02/2014  . Unspecified constipation 09/02/2014  . GERD (gastroesophageal reflux disease) 09/02/2014  . Atherosclerosis of native arteries of extremity with intermittent claudication ([place]) 11/21/2014  . 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 Lumbar facet arthropathy 11/08/2019  . Lumbar spondylosis 11/08/2019  . Lumbar degenerative disc disease 11/08/2019  . Chronic pain syndrome 11/08/2019   Resolved Ambulatory Problems    Diagnosis Date Noted  . No Resolved Ambulatory Problems   Past Medical History:  Diagnosis Date  . Chronic constipation    Constitutional Exam  General appearance: Well nourished, well developed, and well hydrated. In no apparent acute distress Vitals:   11/08/19 1128  BP: (!) 193/69  Pulse: (!) 51  Temp: (!) 97 F (36.1 C)  SpO2: 100%  Weight: 110 lb (49.9 kg)  Height: '5\' 7"'  (1.702 m)   BMI Assessment: Estimated body mass index is 17.23 kg/m as calculated from the following:   Height as of this encounter: '5\' 7"'  (1.702 m).   Weight as of this encounter: 110 lb (49.9 kg).  BMI interpretation table: BMI level Category Range association with higher incidence of chronic pain  <18 kg/m2 Underweight   18.5-24.9 kg/m2 Ideal body weight   25-29.9 kg/m2 Overweight Increased incidence by 20%  30-34.9 kg/m2 Obese (Class I) Increased incidence by 68%  35-39.9 kg/m2 Severe obesity (Class II) Increased incidence by 136%  >40 kg/m2 Extreme obesity (Class III) Increased incidence by 254%   Patient's current BMI Ideal Body weight  Body mass index is 17.23 kg/m. Ideal body weight: 61.6 kg (135 lb 12.9 oz)    BMI Readings from Last 4 Encounters:  11/08/19 17.23 kg/m  05/25/18 19.12 kg/m  09/09/16 18.17 kg/m  09/04/15 18.20 kg/m   Wt Readings from Last 4 Encounters:  11/08/19 110 lb (49.9 kg)  05/25/18 122 lb 1.6 oz (55.4 kg)  09/09/16 116 lb (52.6 kg)  09/04/15 116 lb 3.2 oz (52.7 kg)  Psych/Mental status: Alert, oriented x 3 (person, place, & time)       Eyes: PERLA Respiratory: No evidence of acute respiratory distress  Cervical Spine Area Exam  Skin & Axial Inspection: No masses, redness, edema, swelling, or associated skin lesions Alignment: Symmetrical Functional ROM: Unrestricted ROM      Stability: No instability detected Muscle Tone/Strength: Functionally intact. No obvious neuro-muscular anomalies detected. Sensory (Neurological): Unimpaired Palpation: No palpable anomalies              Upper Extremity (UE) Exam    Side: Right upper extremity  Side: Left upper extremity  Skin & Extremity Inspection: Skin color, temperature, and hair growth are WNL. No peripheral edema or cyanosis. No masses, redness, swelling, asymmetry, or associated skin lesions. No contractures.  Skin & Extremity Inspection: Skin color, temperature, and hair growth are WNL. No peripheral edema or cyanosis. No masses, redness, swelling, asymmetry, or associated skin lesions. No contractures.  Functional ROM: Unrestricted ROM          Functional ROM: Unrestricted ROM          Muscle Tone/Strength: Functionally intact. No obvious neuro-muscular anomalies detected.  Muscle Tone/Strength: Functionally intact. No obvious neuro-muscular anomalies detected.  Sensory (Neurological): Unimpaired          Sensory (Neurological): Unimpaired          Palpation: No palpable anomalies              Palpation: No palpable anomalies              Provocative Test(s):  Phalen's test: deferred Tinel's test: deferred Apley's scratch test (touch opposite shoulder):  Action 1 (Across chest): deferred Action 2 (Overhead):  deferred Action 3 (LB reach): deferred   Provocative Test(s):  Phalen's test: deferred Tinel's test: deferred Apley's scratch test (touch opposite shoulder):  Action 1 (Across chest): deferred Action 2 (Overhead): deferred Action 3 (LB reach): deferred    Thoracic Spine Area Exam  Skin & Axial Inspection: No masses, redness, or swelling Alignment: Symmetrical Functional ROM: Unrestricted ROM Stability: No instability detected Muscle Tone/Strength: Functionally intact. No obvious neuro-muscular anomalies detected. Sensory (Neurological): Unimpaired Muscle strength & Tone: No palpable anomalies  Lumbar Spine Area Exam  Skin & Axial Inspection: No masses, redness, or swelling Alignment: Symmetrical Functional ROM: Decreased ROM affecting both sides, R>L Stability: No instability detected Muscle Tone/Strength: Functionally intact. No obvious neuro-muscular anomalies detected. Sensory (Neurological): Dermatomal pain pattern RIGHT L4/5 Palpation: No palpable anomalies       Provocative Tests: Hyperextension/rotation test: (+) bilaterally for facet joint pain. Lumbar quadrant test (Kemp's test): (+) on the right for foraminal stenosis Lateral bending test: (+) due to pain. Patrick's Maneuver: (+) for right-sided S-I arthralgia             FABER* test: (+) for right-sided S-I arthralgia             S-I anterior distraction/compression test: deferred today         S-I lateral compression test: deferred today         S-I Thigh-thrust test: deferred today         S-I Gaenslen's test: deferred today         *(Flexion, ABduction and External Rotation)  Gait & Posture Assessment  Ambulation: Unassisted Gait: Relatively normal for age and body habitus Posture: Antalgic   Lower Extremity Exam    Side: Right lower extremity  Side: Left lower extremity  Stability: No instability observed          Stability: No instability observed          Skin & Extremity Inspection: Skin color,  temperature, and hair growth are WNL. No peripheral edema or cyanosis. No masses, redness, swelling, asymmetry, or associated skin lesions. No contractures.  Skin & Extremity Inspection: Skin color, temperature, and hair growth are WNL. No peripheral edema or cyanosis. No masses, redness, swelling, asymmetry, or associated skin lesions. No contractures.  Functional ROM: Pain restricted ROM for hip and knee joints          Functional ROM: Unrestricted ROM                  Muscle Tone/Strength: Functionally intact. No obvious neuro-muscular anomalies detected.  Muscle Tone/Strength: Functionally intact. No obvious neuro-muscular anomalies detected.  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 Diagnoses of Lumbar facet arthropathy, Lumbar spondylosis, Lumbar degenerative disc disease, and Chronic pain syndrome were also pertinent to this visit.  Visit Diagnosis (New problems to examiner): 1. 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 Lumbar facet arthropathy   3. Lumbar spondylosis   4. Lumbar degenerative disc disease   5. Chronic pain syndrome    Plan of Care (Initial workup plan)  General Recommendations: The pain condition that the patient suffers from is best treated with a multidisciplinary approach that involves an increase in physical activity to prevent de-conditioning and worsening of the pain cycle, as well as psychological counseling (formal and/or informal) to address the co-morbid psychological affects of pain. Treatment will often involve judicious use of pain medications and interventional  procedures to decrease the pain, allowing the patient to participate in the physical activity that will ultimately produce long-lasting pain reductions. The goal of the multidisciplinary approach is to return the patient to a higher level of overall function and to restore their ability to perform activities of daily living.    Procedure Orders     LESI (Schedule) Pharmacotherapy (current): Medications ordered:  Meds ordered this encounter  Medications  . pregabalin (LYRICA) 25 MG capsule    Sig: Take 1 capsule (25 mg total) by mouth at bedtime for 21 days, THEN 1 capsule (25 mg total) 2 (two) times daily.    Dispense:  99 capsule    Refill:  0   Medications administered during this visit: Rogue Bussing" had no medications administered during this visit.   Pharmacological management options:  Opioid Analgesics: The patient was informed that there is no guarantee that she would be a candidate for opioid analgesics. The decision will be made following CDC guidelines. This decision will be based on the results of diagnostic studies, as well as Ms. Musick's risk profile.   Membrane stabilizer: Has tried and failed gabapentin.  Trial of Lyrica as above.  Cymbalta in future?  Muscle relaxant: Tried and failed baclofen.  Can consider tizanidine or Robaxin in future  NSAID: On Mobic.  Counseled patient to not take meloxicam and ibuprofen together.  Other analgesic(s): To be determined at a later time   Interventional management options: Ms.  Doke was informed that there is no guarantee that she would be a candidate for interventional therapies. The decision will be based on the results of diagnostic studies, as well as Ms. Fredrick's risk profile.  Procedure(s) under consideration:  Interlaminar right lumbar epidural steroid injection   Provider-requested follow-up: Return in about 1 week (around 11/15/2019) for Procedure L4-5 ESI , without sedation.  No future  appointments.  Primary Care Physician: [facility] Location: [facility] Note by: Gillis Santa, MD Date: 11/08/2019; Time: 3:50 PM  Note: This dictation was prepared with Dragon dictation. Any transcriptional errors that may result from this process are unintentional.

## 2019-11-09 NOTE — Telephone Encounter (Signed)
Patient informed of this. Given instructions on how to take.

## 2019-11-15 ENCOUNTER — Encounter: Payer: Self-pay | Admitting: Student in an Organized Health Care Education/Training Program

## 2019-11-15 ENCOUNTER — Ambulatory Visit (HOSPITAL_BASED_OUTPATIENT_CLINIC_OR_DEPARTMENT_OTHER): Payer: Medicare HMO | Admitting: Student in an Organized Health Care Education/Training Program

## 2019-11-15 ENCOUNTER — Other Ambulatory Visit: Payer: Self-pay

## 2019-11-15 ENCOUNTER — Ambulatory Visit
Admission: RE | Admit: 2019-11-15 | Discharge: 2019-11-15 | Disposition: A | Discharge status: Home / Self Care | Payer: Medicare HMO | Source: Ambulatory Visit | Attending: Student in an Organized Health Care Education/Training Program | Admitting: Student in an Organized Health Care Education/Training Program

## 2019-11-15 DIAGNOSIS — G8929 Other chronic pain: Secondary | ICD-10-CM | POA: Diagnosis present

## 2019-11-15 DIAGNOSIS — M5416 Radiculopathy, lumbar region: Secondary | ICD-10-CM | POA: Diagnosis present

## 2019-11-15 MED ORDER — ROPIVACAINE HCL 2 MG/ML IJ SOLN
2.0000 mL | Freq: Once | INTRAMUSCULAR | Status: AC
  Administered 2019-11-15: 2 mL via EPIDURAL

## 2019-11-15 MED ORDER — SODIUM CHLORIDE (PF) 0.9 % IJ SOLN
INTRAMUSCULAR | Status: AC
  Filled 2019-11-15: qty 10

## 2019-11-15 MED ORDER — ROPIVACAINE HCL 2 MG/ML IJ SOLN
INTRAMUSCULAR | Status: AC
  Filled 2019-11-15: qty 10

## 2019-11-15 MED ORDER — DEXAMETHASONE SODIUM PHOSPHATE 10 MG/ML IJ SOLN
INTRAMUSCULAR | Status: AC
  Filled 2019-11-15: qty 1

## 2019-11-15 MED ORDER — LIDOCAINE HCL 2 % IJ SOLN
20.0000 mL | Freq: Once | INTRAMUSCULAR | Status: AC
  Administered 2019-11-15: 400 mg

## 2019-11-15 MED ORDER — LIDOCAINE HCL 2 % IJ SOLN
INTRAMUSCULAR | Status: AC
  Filled 2019-11-15: qty 20

## 2019-11-15 MED ORDER — SODIUM CHLORIDE 0.9% FLUSH
2.0000 mL | Freq: Once | INTRAVENOUS | Status: AC
  Administered 2019-11-15: 2 mL

## 2019-11-15 MED ORDER — DEXAMETHASONE SODIUM PHOSPHATE 10 MG/ML IJ SOLN
10.0000 mg | Freq: Once | INTRAMUSCULAR | Status: AC
  Administered 2019-11-15: 10 mg

## 2019-11-15 MED ORDER — IOHEXOL 180 MG/ML  SOLN
10.0000 mL | Freq: Once | INTRAMUSCULAR | Status: AC
  Administered 2019-11-15: 11:00:00 10 mL via EPIDURAL

## 2019-11-15 MED ORDER — IOHEXOL 180 MG/ML  SOLN
INTRAMUSCULAR | Status: AC
  Filled 2019-11-15: qty 20

## 2019-11-15 NOTE — Progress Notes (Signed)
Safety precautions to be maintained throughout the outpatient stay will include: orient to surroundings, keep bed in low position, maintain call bell within reach at all times, provide assistance with transfer out of bed and ambulation.  

## 2019-11-15 NOTE — Progress Notes (Signed)
Patient's Name: Christy Mccarthy  MRN: [id number]  Referring Provider: Edward Jolly, MD  DOB: 19-Jan-1941  PCP: [facility]  DOS: 11/15/2019  Note by: Edward Jolly, MD  Service setting: Ambulatory outpatient  Specialty: Interventional Pain Management  Patient type: Established  Location: [facility] (AMB) Pain Management Facility  Visit type: Interventional Procedure   Primary Reason for Visit: Interventional Pain Management Treatment. CC: Back Pain (lumbar right is worse )  Procedure:          Anesthesia, Analgesia, Anxiolysis:  Type: Diagnostic Inter-Laminar Epidural Steroid Injection  #1  Region: Lumbar Level: L4-5 Level. Laterality: Right-Sided         Type: Local Anesthesia Lidocaine 1-2%  Position: Prone with head of the table was raised to facilitate breathing.   Indications: 1. 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 (year old), female patient, seen today for interventional treatment. She  has a past surgical history that includes Appendectomy; Abdominal hysterectomy; and Esophagogastroduodenoscopy (N/A, 09/06/2014). Christy Mccarthy has a current medication list which includes the following prescription(s): alprazolam, aspirin ec, atorvastatin, cilostazol, duloxetine, losartan, meloxicam, multiple vitamins-minerals, pantoprazole, ranitidine, and pregabalin. Her primarily concern today is the Back Pain (lumbar right is worse )  Initial Vital Signs:  Pulse/HCG Rate: (!) 111ECG Heart Rate: 86 Temp: 98.5 F (36.9 C) Resp: 16 BP: 110/81 SpO2: 99 %  BMI: Estimated body mass index is 17.49 kg/m as calculated from the following:   Height as of this encounter: 5' 6.5" (1.689 m).   Weight as of this encounter: 110 lb (49.9 kg).  Risk Assessment: Allergies: Reviewed. She is allergic to neurontin [gabapentin]; linzess [linaclotide]; sucralfate; and  sulfa antibiotics.  Allergy Precautions: None required Coagulopathies: Reviewed. None identified.  Blood-thinner therapy: None at this time Active Infection(s): Reviewed. None identified. Christy Mccarthy is afebrile  Site Confirmation: Christy Mccarthy was asked to confirm the procedure and laterality before marking the site Procedure checklist: Completed Consent: Before the procedure and under the influence of no sedative(s), amnesic(s), or anxiolytics, the patient was informed of the treatment options, risks and possible complications. To fulfill our ethical and legal obligations, as recommended by the American Medical Association's Code of Ethics, I have informed the patient of my clinical impression; the nature and purpose of the treatment or procedure; the risks, benefits, and possible complications of the intervention; the alternatives, including doing nothing; the risk(s) and benefit(s) of the alternative treatment(s) or procedure(s); and the risk(s) and benefit(s) of doing nothing. The patient was provided information about the general risks and possible complications associated with the procedure. These may include, but are not limited to: failure to achieve desired goals, infection, bleeding, organ or nerve damage, allergic reactions, paralysis, and death. In addition, the patient was informed of those risks and complications associated to Spine-related procedures, such as failure to decrease pain; infection (i.e.: Meningitis, epidural or intraspinal abscess); bleeding (i.e.: epidural hematoma, subarachnoid hemorrhage, or any other type of intraspinal or peri-dural bleeding); organ or nerve damage (i.e.: Any type of peripheral nerve, nerve root, or spinal cord injury) with subsequent damage to sensory, motor, and/or autonomic systems, resulting in permanent pain, numbness, and/or weakness of one or several areas of the body; allergic reactions; (i.e.: anaphylactic reaction); and/or death. Furthermore, the  patient was informed of those risks and complications associated with the medications. These include, but are not limited to: allergic reactions (  i.e.: anaphylactic or anaphylactoid reaction(s)); adrenal axis suppression; blood sugar elevation that in diabetics may result in ketoacidosis or comma; water retention that in patients with history of congestive heart failure may result in shortness of breath, pulmonary edema, and decompensation with resultant heart failure; weight gain; swelling or edema; medication-induced neural toxicity; particulate matter embolism and blood vessel occlusion with resultant organ, and/or nervous system infarction; and/or aseptic necrosis of one or more joints. Finally, the patient was informed that Medicine is not an exact science; therefore, there is also the possibility of unforeseen or unpredictable risks and/or possible complications that may result in a catastrophic outcome. The patient indicated having understood very clearly. We have given the patient no guarantees and we have made no promises. Enough time was given to the patient to ask questions, all of which were answered to the patient's satisfaction. Ms. Christy Mccarthy has indicated that she wanted to continue with the procedure. Attestation: I, the ordering provider, attest that I have discussed with the patient the benefits, risks, side-effects, alternatives, likelihood of achieving goals, and potential problems during recovery for the procedure that I have provided informed consent. Date  Time: 11/15/2019 10:12 AM  Pre-Procedure Preparation:  Monitoring: As per clinic protocol. Respiration, ETCO2, SpO2, BP, heart rate and rhythm monitor placed and checked for adequate function Safety Precautions: Patient was assessed for positional comfort and pressure points before starting the procedure. Time-out: I initiated and conducted the "Time-out" before starting the procedure, as per protocol. The patient was asked to  participate by confirming the accuracy of the "Time Out" information. Verification of the correct person, site, and procedure were performed and confirmed by me, the nursing staff, and the patient. "Time-out" conducted as per Joint Commission's Universal Protocol (UP.01.01.01). Time: 1105  Description of Procedure:          Target Area: The interlaminar space, initially targeting the lower laminar border of the superior vertebral body. Approach: Paramedial approach. Area Prepped: Entire Posterior Lumbar Region Prepping solution: DuraPrep (Iodine Povacrylex [0.7% available iodine] and Isopropyl Alcohol, 74% w/w) Safety Precautions: Aspiration looking for blood return was conducted prior to all injections. At no point did we inject any substances, as a needle was being advanced. No attempts were made at seeking any paresthesias. Safe injection practices and needle disposal techniques used. Medications properly checked for expiration dates. SDV (single dose vial) medications used. Description of the Procedure: Protocol guidelines were followed. The procedure needle was introduced through the skin, ipsilateral to the reported pain, and advanced to the target area. Bone was contacted and the needle walked caudad, until the lamina was cleared. The epidural space was identified using "loss-of-resistance technique" with 2-3 ml of PF-NaCl (0.9% NSS), in a 5cc LOR glass syringe.  Vitals:   11/15/19 1048 11/15/19 1105 11/15/19 1110 11/15/19 1115  BP: 110/81 (!) 151/80 134/79 133/81  Pulse: (!) 111     Resp: 16 18 12 14   Temp: 98.5 F (36.9 C)     TempSrc: Oral     SpO2: 99% 99% 95% 95%  Weight: 110 lb (49.9 kg)     Height: 5' 6.5" (1.689 m)       Start Time: 1105 hrs. End Time: 1114 hrs.  Materials:  Needle(s) Type: Epidural needle Gauge: 22G Length: 3.5-in Medication(s): Please see orders for medications and dosing details. 7 cc solution made of 4 cc of preservative-free saline, 2 cc of 0.2%  ropivacaine, 1 cc of Decadron 10 mg/cc.  Imaging Guidance (Spinal):  Type of Imaging Technique: Fluoroscopy Guidance (Spinal) Indication(s): Assistance in needle guidance and placement for procedures requiring needle placement in or near specific anatomical locations not easily accessible without such assistance. Exposure Time: Please see nurses notes. Contrast: Before injecting any contrast, we confirmed that the patient did not have an allergy to iodine, shellfish, or radiological contrast. Once satisfactory needle placement was completed at the desired level, radiological contrast was injected. Contrast injected under live fluoroscopy. No contrast complications. See chart for type and volume of contrast used. Fluoroscopic Guidance: I was personally present during the use of fluoroscopy. "Tunnel Vision Technique" used to obtain the best possible view of the target area. Parallax error corrected before commencing the procedure. "Direction-depth-direction" technique used to introduce the needle under continuous pulsed fluoroscopy. Once target was reached, antero-posterior, oblique, and lateral fluoroscopic projection used confirm needle placement in all planes. Images permanently stored in EMR. Interpretation: I personally interpreted the imaging intraoperatively. Adequate needle placement confirmed in multiple planes. Appropriate spread of contrast into desired area was observed. No evidence of afferent or efferent intravascular uptake. No intrathecal or subarachnoid spread observed. Permanent images saved into the patient's record.  Antibiotic Prophylaxis:   Anti-infectives (From admission, onward)   None     Indication(s): None identified  Post-operative Assessment:  Post-procedure Vital Signs:  Pulse/HCG Rate: (!) 111(!) 111 Temp: 98.5 F (36.9 C) Resp: 14 BP: 133/81 SpO2: 95 %  EBL: None  Complications: No immediate post-treatment complications observed by team, or reported  by patient.  Note: The patient tolerated the entire procedure well. A repeat set of vitals were taken after the procedure and the patient was kept under observation following institutional policy, for this type of procedure. Post-procedural neurological assessment was performed, showing return to baseline, prior to discharge. The patient was provided with post-procedure discharge instructions, including a section on how to identify potential problems. Should any problems arise concerning this procedure, the patient was given instructions to immediately contact us, at any time, without hesitation. In any case, we plan to contact the patient by telephone for a follow-up status report regarding this interventional procedure.  Comments:  No additional relevant information.  Plan of Care  Orders:  Orders Placed This Encounter  Procedures  . Fluoro (C-Arm) (<60 min) (No Report)    Intraoperative interpretation by procedural physician at [facility].    Standing Status:   Standing    Number of Occurrences:   1    Order Specific Question:   Reason for exam:    Answer:   Assistance in needle guidance and placement for procedures requiring needle placement in or near specific anatomical locations not easily accessible without such assistance.   Medications ordered for procedure: Meds ordered this encounter  Medications  . iohexol (OMNIPAQUE) 180 MG/ML injection 10 mL    Must be Myelogram-compatible. If not available, you may substitute with a water-soluble, non-ionic, hypoallergenic, myelogram-compatible radiological contrast medium.  Marland Kitchen lidocaine (XYLOCAINE) 2 % (with pres) injection 400 mg  . ropivacaine (PF) 2 mg/mL (0.2%) (NAROPIN) injection 2 mL  . sodium chloride flush (NS) 0.9 % injection 2 mL  . dexamethasone (DECADRON) injection 10 mg   Medications administered: We administered iohexol, lidocaine, ropivacaine (PF) 2 mg/mL (0.2%), sodium chloride flush, and dexamethasone.  See  the medical record for exact dosing, route, and time of administration.  Follow-up plan:   Return in about 7 weeks (around 01/03/2020) for Post Procedure Evaluation, virtual.      s/p RIGHT L4/5 ESI #  1 on 11/15/2019, 7 cc injected   Recent Visits Date Type Provider Dept  11/08/19 Office Visit Gillis Santa, MD [facility]  Showing recent visits within past 90 days and meeting all other requirements   Today's Visits Date Type Provider Dept  11/15/19 Procedure visit Gillis Santa, MD [facility]  Showing today's visits and meeting all other requirements   Future Appointments Date Type Provider Dept  01/04/20 Appointment Gillis Santa, MD [facility]  Showing future appointments within next 90 days and meeting all other requirements   Disposition: Discharge home  Discharge Date & Time: 11/15/2019; 1130 hrs.   Primary Care Physician: [facility] Location: [facility] Note by: Gillis Santa, MD Date: 11/15/2019; Time: 11:24 AM  Disclaimer:  Medicine is not an exact science. The only guarantee in medicine is that nothing is guaranteed. It is important to note that the decision to proceed with this intervention was based on the information collected from the patient. The Data and conclusions were drawn from the patient's questionnaire, the interview, and the physical examination. Because the information was provided in large part by the patient, it cannot be guaranteed that it has not been purposely or unconsciously manipulated. Every effort has been made to obtain as much relevant data as possible for this evaluation. It is important to note that the conclusions that lead to this procedure are derived in large part from the available data. Always take into account that the treatment will also be dependent on availability of resources and existing treatment guidelines, considered by other Pain Management  Practitioners as being common knowledge and practice, at the time of the intervention. For Medico-Legal purposes, it is also important to point out that variation in procedural techniques and pharmacological choices are the acceptable norm. The indications, contraindications, technique, and results of the above procedure should only be interpreted and judged by a Board-Certified Interventional Pain Specialist with extensive familiarity and expertise in the same exact procedure and technique.

## 2019-11-16 ENCOUNTER — Telehealth: Payer: Self-pay

## 2019-11-16 NOTE — Telephone Encounter (Signed)
Post procedure phone call.  Patient states she is doing well.  

## 2020-01-03 ENCOUNTER — Encounter: Payer: Self-pay | Admitting: Student in an Organized Health Care Education/Training Program

## 2020-01-04 ENCOUNTER — Encounter: Payer: Self-pay | Admitting: Student in an Organized Health Care Education/Training Program

## 2020-01-04 ENCOUNTER — Ambulatory Visit
Payer: Medicare PPO | Attending: Student in an Organized Health Care Education/Training Program | Admitting: Student in an Organized Health Care Education/Training Program

## 2020-01-04 ENCOUNTER — Other Ambulatory Visit: Payer: Self-pay

## 2020-01-04 DIAGNOSIS — M4726 Other spondylosis with radiculopathy, lumbar region: Secondary | ICD-10-CM | POA: Diagnosis not present

## 2020-01-04 DIAGNOSIS — M5416 Radiculopathy, lumbar region: Secondary | ICD-10-CM

## 2020-01-04 DIAGNOSIS — M47816 Spondylosis without myelopathy or radiculopathy, lumbar region: Secondary | ICD-10-CM

## 2020-01-04 DIAGNOSIS — G894 Chronic pain syndrome: Secondary | ICD-10-CM

## 2020-01-04 DIAGNOSIS — G8929 Other chronic pain: Secondary | ICD-10-CM

## 2020-01-04 NOTE — Progress Notes (Signed)
Patient: Christy Mccarthy  Service Category: E/M  Provider: Gillis Santa, MD  DOB: [birth year]  DOS: 01/04/2020  Location: Office  MRN: [id number]  Setting: Ambulatory outpatient  Referring Provider: [facility]  Type: Established Patient  Specialty: Interventional Pain Management  PCP: [facility]  Location: Home  Delivery: TeleHealth     Virtual Encounter - Pain Management PROVIDER NOTE: Information contained herein reflects review and annotations entered in association with encounter. Interpretation of such information and data should be left to medically-trained personnel. Information provided to patient can be located elsewhere in the medical record under "Patient Instructions". Document created using STT-dictation technology, any transcriptional errors that may result from process are unintentional.    Contact & Pharmacy Preferred: [phone] Home: [phone] (home) Mobile: [phone] (mobile) E-mail: [email or web address], [place] [address] Phone: [phone] Fax: [phone]   Pre-screening  Ms. Nass offered "in-person" vs "virtual" encounter. She indicated preferring virtual for this encounter.   Reason COVID-19*  Social distancing based on CDC and AMA recommendations.   I contacted Christy Mccarthy on 01/04/2020 via telephone.      I clearly identified myself as Gillis Santa, MD. I verified that I was speaking with the correct person using two identifiers (Name: Christy Mccarthy, and date of birth: August 13, 1941).  This visit was completed via telephone due to the restrictions of the COVID-19 pandemic. All issues as above were discussed and addressed but no physical exam was performed. If it was felt that the patient should be evaluated in the office, they were directed there. The patient verbally consented to this visit. Patient was unable to complete an  audio/visual visit due to Technical difficulties and/or Lack of internet. Due to the catastrophic nature of the COVID-19 pandemic, this visit was done through audio contact only.  Location of the patient: home address (see Epic for details)  Location of the provider: office  Consent I sought verbal advanced consent from Christy Mccarthy for virtual visit interactions. I informed Christy Mccarthy of possible security and privacy concerns, risks, and limitations associated with providing "not-in-person" medical evaluation and management services. I also informed Christy Mccarthy of the availability of "in-person" appointments. Finally, I informed her that there would be a charge for the virtual visit and that she could be  personally, fully or partially, financially responsible for it. Christy Mccarthy expressed understanding and agreed to proceed.   Historic Elements   Christy Mccarthy is a 79 y.o. year old, female patient evaluated today after her last encounter by our practice on 11/16/2019. Christy Mccarthy  has a past medical history of Chronic constipation and GERD (gastroesophageal reflux disease). She also  has a past surgical history that includes Appendectomy; Abdominal hysterectomy; and Esophagogastroduodenoscopy (N/A, 09/06/2014). Christy Mccarthy has a current medication list which includes the following prescription(s): alprazolam, aspirin ec, atorvastatin, cilostazol, losartan, meloxicam, multiple vitamins-minerals, pantoprazole, duloxetine, pregabalin, and ranitidine. She  reports that she has been smoking cigarettes. She has a 6.25 pack-year smoking history. She has never used smokeless tobacco. She reports that she does not drink alcohol or use drugs. Christy Mccarthy is allergic to neurontin [gabapentin]; linzess [linaclotide]; sucralfate; and sulfa antibiotics.   HPI  Today, she is being contacted for a post-procedure assessment.  Evaluation of last interventional procedure  11/15/2019 Procedure: RIGHT L4-5  ESI  Sedation: Please see nurses note for DOS. When no sedatives are  used, the analgesic levels obtained are directly associated to the effectiveness of the local anesthetics. However, when sedation is provided, the level of analgesia obtained during the initial 1 hour following the intervention, is believed to be the result of a combination of factors. These factors may include, but are not limited to: 1. The effectiveness of the local anesthetics used. 2. The effects of the analgesic(s) and/or anxiolytic(s) used. 3. The degree of discomfort experienced by the patient at the time of the procedure. 4. The patients ability and reliability in recalling and recording the events. 5. The presence and influence of possible secondary gains and/or psychosocial factors. Reported result: Relief experienced during the 1st hour after the procedure: 100 % (Ultra-Short Term Relief)            Interpretative annotation: Clinically appropriate result. Analgesia during this period is likely to be Local Anesthetic and/or IV Sedative (Analgesic/Anxiolytic) related.          Effects of local anesthetic: The analgesic effects attained during this period are directly associated to the localized infiltration of local anesthetics and therefore cary significant diagnostic value as to the etiological location, or anatomical origin, of the pain. Expected duration of relief is directly dependent on the pharmacodynamics of the local anesthetic used. Long-acting (4-6 hours) anesthetics used.  Reported result: Relief during the next 4 to 6 hour after the procedure: 100 % (Short-Term Relief)            Interpretative annotation: Clinically appropriate result. Analgesia during this period is likely to be Local Anesthetic-related.          Long-term benefit: Defined as the period of time past the expected duration of local anesthetics (1 hour for short-acting and 4-6 hours for long-acting). With the possible exception of prolonged  sympathetic blockade from the local anesthetics, benefits during this period are typically attributed to, or associated with, other factors such as analgesic sensory neuropraxia, antiinflammatory effects, or beneficial biochemical changes provided by agents other than the local anesthetics.  Reported result: Extended relief following procedure: 100 % (Long-Term Relief)            Interpretative annotation: Clinically appropriate result. Good relief. Therapeutic success. Inflammation plays a part in the etiology to the pain.           Laboratory Chemistry Profile (12 mo)  Renal: No results found for requested labs within last 8760 hours.  Lab Results  Component Value Date   GFRAA >90 08/21/2014   GFRNONAA 84 (L) 08/21/2014   Hepatic: No results found for requested labs within last 8760 hours. Lab Results  Component Value Date   AST 21 08/21/2014   ALT 12 08/21/2014   Other: No results found for requested labs within last 8760 hours. Note: Above Lab results reviewed.   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 Diagnoses of Lumbar facet arthropathy and Chronic pain syndrome were also pertinent to this visit.  Plan of Care   100% pain relief after Right L4-5 ESI that is still on-going. Patient pleased with results and states that her pain has improved and she is more functional. Repeat ESI prn.   I am having Christy Mccarthy "Christy Mccarthy" maintain her pantoprazole, cilostazol, atorvastatin, ALPRAZolam, Multiple Vitamins-Minerals (CENTRUM SILVER PO), aspirin EC, ranitidine, losartan, meloxicam, pregabalin, and DULoxetine.  Orders:  Orders Placed This Encounter  Procedures  . LESI (PRN)    Standing Status:   Standing    Number of Occurrences:  9    Standing Expiration Date:   07/03/2021    Scheduling Instructions:     Purpose: Palliative     Indication: Lower extremity pain/Sciatica unspecified side (M54.30).     Side: Midline     Level: TBD      Sedation: Patient's choice.     TIMEFRAME: PRN procedure. (Christy Mccarthy will call when needed.)    Order Specific Question:   Where will this procedure be performed?    Answer:   [facility]   Follow-up plan:   No follow-ups on file.     s/p RIGHT L4/5 ESI #1 on 11/15/2019, 7 cc injected- helped significantly, repeat PRN    Recent Visits Date Type Provider Dept  11/15/19 Procedure visit Edward Jolly, MD [facility]  11/08/19 Office Visit Edward Jolly, MD [facility]  Showing recent visits within past 90 days and meeting all other requirements   Today's Visits Date Type Provider Dept  01/04/20 Office Visit Edward Jolly, MD [facility]  Showing today's visits and meeting all other requirements   Future Appointments No visits were found meeting these conditions.  Showing future appointments within next 90 days and meeting all other requirements   I discussed the assessment and treatment plan with the patient. The patient was provided an opportunity to ask questions and all were answered. The patient agreed with the plan and demonstrated an understanding of the instructions.  Patient advised to call back or seek an in-person evaluation if the symptoms or condition worsens.  Total duration of non-face-to-face encounter: 20 minutes.  Note by: Edward Jolly, MD Date: 01/04/2020; Time: 2:00 PM

## 2020-10-09 ENCOUNTER — Telehealth: Payer: Self-pay

## 2020-10-09 DIAGNOSIS — G894 Chronic pain syndrome: Secondary | ICD-10-CM

## 2020-10-09 DIAGNOSIS — M5416 Radiculopathy, lumbar region: Secondary | ICD-10-CM

## 2020-10-09 DIAGNOSIS — G8929 Other chronic pain: Secondary | ICD-10-CM

## 2020-10-09 NOTE — Telephone Encounter (Signed)
Ok, got it authorized and scheduled for 10/23/20

## 2020-10-09 NOTE — Telephone Encounter (Signed)
Please advise 

## 2020-10-09 NOTE — Telephone Encounter (Signed)
The patient called in wanting to schedule an Lesi. She has not been seen since January. She said Dr. Cherylann Ratel told her she wouldn't have to have an appt in order to schedule this. I just wanted to make sure it was ok to schedule for the procedure. Marland Kitchen

## 2020-10-23 ENCOUNTER — Encounter: Payer: Self-pay | Admitting: Student in an Organized Health Care Education/Training Program

## 2020-10-23 ENCOUNTER — Other Ambulatory Visit: Payer: Self-pay

## 2020-10-23 ENCOUNTER — Ambulatory Visit (HOSPITAL_BASED_OUTPATIENT_CLINIC_OR_DEPARTMENT_OTHER): Payer: Medicare PPO | Admitting: Student in an Organized Health Care Education/Training Program

## 2020-10-23 ENCOUNTER — Ambulatory Visit
Admission: RE | Admit: 2020-10-23 | Discharge: 2020-10-23 | Disposition: A | Discharge status: Home / Self Care | Payer: Medicare PPO | Source: Ambulatory Visit | Attending: Student in an Organized Health Care Education/Training Program | Admitting: Student in an Organized Health Care Education/Training Program

## 2020-10-23 DIAGNOSIS — G894 Chronic pain syndrome: Secondary | ICD-10-CM

## 2020-10-23 DIAGNOSIS — G8929 Other chronic pain: Secondary | ICD-10-CM

## 2020-10-23 DIAGNOSIS — M5416 Radiculopathy, lumbar region: Secondary | ICD-10-CM

## 2020-10-23 MED ORDER — DEXAMETHASONE SODIUM PHOSPHATE 10 MG/ML IJ SOLN
INTRAMUSCULAR | Status: AC
  Filled 2020-10-23: qty 1

## 2020-10-23 MED ORDER — METHOCARBAMOL 500 MG PO TABS: 500.0000 mg | ORAL_TABLET | Freq: Two times a day (BID) | ORAL | 0 refills | Status: AC | PRN

## 2020-10-23 MED ORDER — SODIUM CHLORIDE (PF) 0.9 % IJ SOLN
INTRAMUSCULAR | Status: AC
  Filled 2020-10-23: qty 10

## 2020-10-23 MED ORDER — SODIUM CHLORIDE 0.9% FLUSH
2.0000 mL | Freq: Once | INTRAVENOUS | Status: AC
  Administered 2020-10-23: 2 mL

## 2020-10-23 MED ORDER — ROPIVACAINE HCL 2 MG/ML IJ SOLN
2.0000 mL | Freq: Once | INTRAMUSCULAR | Status: AC
  Administered 2020-10-23: 2 mL via EPIDURAL

## 2020-10-23 MED ORDER — IOHEXOL 180 MG/ML  SOLN
10.0000 mL | Freq: Once | INTRAMUSCULAR | Status: AC
  Administered 2020-10-23: 10 mL via EPIDURAL

## 2020-10-23 MED ORDER — LIDOCAINE HCL 2 % IJ SOLN
INTRAMUSCULAR | Status: AC
  Filled 2020-10-23: qty 20

## 2020-10-23 MED ORDER — LIDOCAINE HCL 2 % IJ SOLN
20.0000 mL | Freq: Once | INTRAMUSCULAR | Status: AC
  Administered 2020-10-23: 400 mg

## 2020-10-23 MED ORDER — ROPIVACAINE HCL 2 MG/ML IJ SOLN
INTRAMUSCULAR | Status: AC
  Filled 2020-10-23: qty 10

## 2020-10-23 MED ORDER — DEXAMETHASONE SODIUM PHOSPHATE 10 MG/ML IJ SOLN
10.0000 mg | Freq: Once | INTRAMUSCULAR | Status: AC
  Administered 2020-10-23: 10 mg

## 2020-10-23 NOTE — Progress Notes (Signed)
Safety precautions to be maintained throughout the outpatient stay will include: orient to surroundings, keep bed in low position, maintain call bell within reach at all times, provide assistance with transfer out of bed and ambulation.  

## 2020-10-23 NOTE — Progress Notes (Signed)
Patient's Name: Christy Mccarthy  MRN: [id number]  Referring Provider: Edward Jolly, MD  DOB: [birth year]  PCP: [facility]  DOS: 10/23/2020  Note by: Edward Jolly, MD  Service setting: Ambulatory outpatient  Specialty: Interventional Pain Management  Patient type: Established  Location: [facility] (AMB) Pain Management Facility  Visit type: Interventional Procedure   Primary Reason for Visit: Interventional Pain Management Treatment. CC: Back Pain (lumbar bilateral )  Procedure:          Anesthesia, Analgesia, Anxiolysis:  Type: Therapeutic Inter-Laminar Epidural Steroid Injection  #2 (#1 done 11/15/2019) Region: Lumbar Level: L4-5 Level. Laterality: Midline         Type: Local Anesthesia Lidocaine 1-2%  Position: Prone with head of the table was raised to facilitate breathing.   Indications: 1. 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 Chronic pain syndrome    Pain Score: Pre-procedure: 10-Worst pain ever (when walking)/10 Post-procedure: 0-No pain/10   Pre-op Assessment:  Christy Mccarthy is a 79 y.o. (year old), female patient, seen today for interventional treatment. She  has a past surgical history that includes Appendectomy; Abdominal hysterectomy; and Esophagogastroduodenoscopy (N/A, 09/06/2014). Christy Mccarthy has a current medication list which includes the following prescription(s): alprazolam, aspirin ec, atorvastatin, cilostazol, losartan, meloxicam, multiple vitamins-minerals, pantoprazole, methocarbamol, and ranitidine. Her primarily concern today is the Back Pain (lumbar bilateral )  Initial Vital Signs:  Pulse/HCG Rate: 94ECG Heart Rate: 85 Temp: (!) 97.3 F (36.3 C) Resp: 16 BP: (!) 163/85 SpO2: 100 %  BMI: Estimated body mass index is 16.69 kg/m as calculated from the following:   Height as of this encounter: 5' 6.5" (1.689 m).   Weight as of this encounter: 105 lb (47.6 kg).  Risk Assessment: Allergies: Reviewed. She is allergic to neurontin  [gabapentin], linzess [linaclotide], sucralfate, and sulfa antibiotics.  Allergy Precautions: None required Coagulopathies: Reviewed. None identified.  Blood-thinner therapy: None at this time Active Infection(s): Reviewed. None identified. Christy Mccarthy is afebrile  Site Confirmation: Christy Mccarthy was asked to confirm the procedure and laterality before marking the site Procedure checklist: Completed Consent: Before the procedure and under the influence of no sedative(s), amnesic(s), or anxiolytics, the patient was informed of the treatment options, risks and possible complications. To fulfill our ethical and legal obligations, as recommended by the American Medical Association's Code of Ethics, I have informed the patient of my clinical impression; the nature and purpose of the treatment or procedure; the risks, benefits, and possible complications of the intervention; the alternatives, including doing nothing; the risk(s) and benefit(s) of the alternative treatment(s) or procedure(s); and the risk(s) and benefit(s) of doing nothing. The patient was provided information about the general risks and possible complications associated with the procedure. These may include, but are not limited to: failure to achieve desired goals, infection, bleeding, organ or nerve damage, allergic reactions, paralysis, and death. In addition, the patient was informed of those risks and complications associated to Spine-related procedures, such as failure to decrease pain; infection (i.e.: Meningitis, epidural or intraspinal abscess); bleeding (i.e.: epidural hematoma, subarachnoid hemorrhage, or any other type of intraspinal or peri-dural bleeding); organ or nerve damage (i.e.: Any type of peripheral nerve, nerve root, or spinal cord injury) with subsequent damage to sensory, motor, and/or autonomic systems, resulting in permanent pain, numbness, and/or weakness of one or several areas of the body; allergic reactions; (i.e.:  anaphylactic reaction); and/or death. Furthermore, the patient was informed of those risks and complications associated with the medications. These include,  but are not limited to: allergic reactions (i.e.: anaphylactic or anaphylactoid reaction(s)); adrenal axis suppression; blood sugar elevation that in diabetics may result in ketoacidosis or comma; water retention that in patients with history of congestive heart failure may result in shortness of breath, pulmonary edema, and decompensation with resultant heart failure; weight gain; swelling or edema; medication-induced neural toxicity; particulate matter embolism and blood vessel occlusion with resultant organ, and/or nervous system infarction; and/or aseptic necrosis of one or more joints. Finally, the patient was informed that Medicine is not an exact science; therefore, there is also the possibility of unforeseen or unpredictable risks and/or possible complications that may result in a catastrophic outcome. The patient indicated having understood very clearly. We have given the patient no guarantees and we have made no promises. Enough time was given to the patient to ask questions, all of which were answered to the patient's satisfaction. Christy Mccarthy has indicated that she wanted to continue with the procedure. Attestation: I, the ordering provider, attest that I have discussed with the patient the benefits, risks, side-effects, alternatives, likelihood of achieving goals, and potential problems during recovery for the procedure that I have provided informed consent. Date  Time: 10/23/2020 12:46 PM  Pre-Procedure Preparation:  Monitoring: As per clinic protocol. Respiration, ETCO2, SpO2, BP, heart rate and rhythm monitor placed and checked for adequate function Safety Precautions: Patient was assessed for positional comfort and pressure points before starting the procedure. Time-out: I initiated and conducted the "Time-out" before starting the  procedure, as per protocol. The patient was asked to participate by confirming the accuracy of the "Time Out" information. Verification of the correct person, site, and procedure were performed and confirmed by me, the nursing staff, and the patient. "Time-out" conducted as per Joint Commission's Universal Protocol (UP.01.01.01). Time: 1114  Description of Procedure:          Target Area: The interlaminar space, initially targeting the lower laminar border of the superior vertebral body. Approach: Paramedial approach. Area Prepped: Entire Posterior Lumbar Region Prepping solution: DuraPrep (Iodine Povacrylex [0.7% available iodine] and Isopropyl Alcohol, 74% w/w) Safety Precautions: Aspiration looking for blood return was conducted prior to all injections. At no point did we inject any substances, as a needle was being advanced. No attempts were made at seeking any paresthesias. Safe injection practices and needle disposal techniques used. Medications properly checked for expiration dates. SDV (single dose vial) medications used. Description of the Procedure: Protocol guidelines were followed. The procedure needle was introduced through the skin, ipsilateral to the reported pain, and advanced to the target area. Bone was contacted and the needle walked caudad, until the lamina was cleared. The epidural space was identified using "loss-of-resistance technique" with 2-3 ml of PF-NaCl (0.9% NSS), in a 5cc LOR glass syringe.  Vitals:   10/23/20 1250 10/23/20 1310 10/23/20 1315 10/23/20 1320  BP: (!) 163/85 (!) 175/71 (!) 176/69 (!) 161/70  Pulse: 94     Resp: 16 18 16 18   Temp: (!) 97.3 F (36.3 C)     TempSrc: Temporal     SpO2: 100% 100% 100% 100%  Weight: 105 lb (47.6 kg)     Height: 5' 6.5" (1.689 m)       Start Time: 1114 hrs. End Time: 1119 hrs.  Materials:  Needle(s) Type: Epidural needle Gauge: 22G Length: 3.5-in Medication(s): Please see orders for medications and dosing  details. 7 cc solution made of 4 cc of preservative-free saline, 2 cc of 0.2% ropivacaine, 1 cc of Decadron  10 mg/cc.  Imaging Guidance (Spinal):          Type of Imaging Technique: Fluoroscopy Guidance (Spinal) Indication(s): Assistance in needle guidance and placement for procedures requiring needle placement in or near specific anatomical locations not easily accessible without such assistance. Exposure Time: Please see nurses notes. Contrast: Before injecting any contrast, we confirmed that the patient did not have an allergy to iodine, shellfish, or radiological contrast. Once satisfactory needle placement was completed at the desired level, radiological contrast was injected. Contrast injected under live fluoroscopy. No contrast complications. See chart for type and volume of contrast used. Fluoroscopic Guidance: I was personally present during the use of fluoroscopy. "Tunnel Vision Technique" used to obtain the best possible view of the target area. Parallax error corrected before commencing the procedure. "Direction-depth-direction" technique used to introduce the needle under continuous pulsed fluoroscopy. Once target was reached, antero-posterior, oblique, and lateral fluoroscopic projection used confirm needle placement in all planes. Images permanently stored in EMR. Interpretation: I personally interpreted the imaging intraoperatively. Adequate needle placement confirmed in multiple planes. Appropriate spread of contrast into desired area was observed. No evidence of afferent or efferent intravascular uptake. No intrathecal or subarachnoid spread observed. Permanent images saved into the patient's record.  Antibiotic Prophylaxis:   Anti-infectives (From admission, onward)   None     Indication(s): None identified  Post-operative Assessment:  Post-procedure Vital Signs:  Pulse/HCG Rate: 9490 Temp: (!) 97.3 F (36.3 C) Resp: 18 BP: (!) 161/70 SpO2: 100 %  EBL:  None  Complications: No immediate post-treatment complications observed by team, or reported by patient.  Note: The patient tolerated the entire procedure well. A repeat set of vitals were taken after the procedure and the patient was kept under observation following institutional policy, for this type of procedure. Post-procedural neurological assessment was performed, showing return to baseline, prior to discharge. The patient was provided with post-procedure discharge instructions, including a section on how to identify potential problems. Should any problems arise concerning this procedure, the patient was given instructions to immediately contact us, at any time, without hesitation. In any case, we plan to contact the patient by telephone for a follow-up status report regarding this interventional procedure.  Comments:  No additional relevant information.  Plan of Care  Orders:  Orders Placed This Encounter  Procedures  . DG PAIN CLINIC C-ARM 1-60 MIN NO REPORT    Intraoperative interpretation by procedural physician at [facility].    Standing Status:   Standing    Number of Occurrences:   1    Order Specific Question:   Reason for exam:    Answer:   Assistance in needle guidance and placement for procedures requiring needle placement in or near specific anatomical locations not easily accessible without such assistance.   Medications ordered for procedure: Meds ordered this encounter  Medications  . iohexol (OMNIPAQUE) 180 MG/ML injection 10 mL    Must be Myelogram-compatible. If not available, you may substitute with a water-soluble, non-ionic, hypoallergenic, myelogram-compatible radiological contrast medium.  Marland Kitchen lidocaine (XYLOCAINE) 2 % (with pres) injection 400 mg  . ropivacaine (PF) 2 mg/mL (0.2%) (NAROPIN) injection 2 mL  . sodium chloride flush (NS) 0.9 % injection 2 mL  . dexamethasone (DECADRON) injection 10 mg  . methocarbamol (ROBAXIN) 500 MG tablet    Sig:  Take 1 tablet (500 mg total) by mouth 2 (two) times daily as needed for muscle spasms (and low back pain/muscle cramps).    Dispense:  60 tablet  Refill:  0    Do not place this medication, or any other prescription from our practice, on "Automatic Refill". Patient may have prescription filled one day early if pharmacy is closed on scheduled refill date.   Medications administered: We administered iohexol, lidocaine, ropivacaine (PF) 2 mg/mL (0.2%), sodium chloride flush, and dexamethasone.  See the medical record for exact dosing, route, and time of administration.  Follow-up plan:   Return in about 5 weeks (around 11/27/2020) for Post Procedure Evaluation, virtual.      s/p RIGHT L4/5 ESI #1 on 11/15/2019, 7 cc injected, 10/23/20: 7 cc injected   Recent Visits No visits were found meeting these conditions. Showing recent visits within past 90 days and meeting all other requirements Today's Visits Date Type Provider Dept  10/23/20 Procedure visit Edward Jolly, MD [facility]  Showing today's visits and meeting all other requirements Future Appointments Date Type Provider Dept  11/22/20 Appointment Edward Jolly, MD [facility]  Showing future appointments within next 90 days and meeting all other requirements  Disposition: Discharge home  Discharge Date & Time: 10/23/2020; 1330 hrs.   Primary Care Physician: [facility] Location: [facility] Note by: Edward Jolly, MD Date: 10/23/2020; Time: 1:34 PM  Disclaimer:  Medicine is not an exact science. The only guarantee in medicine is that nothing is guaranteed. It is important to note that the decision to proceed with this intervention was based on the information collected from the patient. The Data and conclusions were drawn from the patient's questionnaire, the interview, and the physical examination. Because the information was provided in large part by the  patient, it cannot be guaranteed that it has not been purposely or unconsciously manipulated. Every effort has been made to obtain as much relevant data as possible for this evaluation. It is important to note that the conclusions that lead to this procedure are derived in large part from the available data. Always take into account that the treatment will also be dependent on availability of resources and existing treatment guidelines, considered by other Pain Management Practitioners as being common knowledge and practice, at the time of the intervention. For Medico-Legal purposes, it is also important to point out that variation in procedural techniques and pharmacological choices are the acceptable norm. The indications, contraindications, technique, and results of the above procedure should only be interpreted and judged by a Board-Certified Interventional Pain Specialist with extensive familiarity and expertise in the same exact procedure and technique.

## 2020-10-23 NOTE — Patient Instructions (Signed)
GENERAL RISKS AND COMPLICATIONS  What are the risk, side effects and possible complications? Generally speaking, most procedures are safe.  However, with any procedure there are risks, side effects, and the possibility of complications.  The risks and complications are dependent upon the sites that are lesioned, or the type of nerve block to be performed.  The closer the procedure is to the spine, the more serious the risks are.  Great care is taken when placing the radio frequency needles, block needles or lesioning probes, but sometimes complications can occur. 1. Infection: Any time there is an injection through the skin, there is a risk of infection.  This is why sterile conditions are used for these blocks.  There are four possible types of infection. 1. Localized skin infection. 2. Central Nervous System Infection-This can be in the form of Meningitis, which can be deadly. 3. Epidural Infections-This can be in the form of an epidural abscess, which can cause pressure inside of the spine, causing compression of the spinal cord with subsequent paralysis. This would require an emergency surgery to decompress, and there are no guarantees that the patient would recover from the paralysis. 4. Discitis-This is an infection of the intervertebral discs.  It occurs in about 1% of discography procedures.  It is difficult to treat and it may lead to surgery.        2. Pain: the needles have to go through skin and soft tissues, will cause soreness.       3. Damage to internal structures:  The nerves to be lesioned may be near blood vessels or    other nerves which can be potentially damaged.       4. Bleeding: Bleeding is more common if the patient is taking blood thinners such as  aspirin, Coumadin, Ticiid, Plavix, etc., or if he/she have some genetic predisposition  such as hemophilia. Bleeding into the spinal canal can cause compression of the spinal  cord with subsequent paralysis.  This would require an  emergency surgery to  decompress and there are no guarantees that the patient would recover from the  paralysis.       5. Pneumothorax:  Puncturing of a lung is a possibility, every time a needle is introduced in  the area of the chest or upper back.  Pneumothorax refers to free air around the  collapsed lung(s), inside of the thoracic cavity (chest cavity).  Another two possible  complications related to a similar event would include: Hemothorax and Chylothorax.   These are variations of the Pneumothorax, where instead of air around the collapsed  lung(s), you may have blood or chyle, respectively.       6. Spinal headaches: They may occur with any procedures in the area of the spine.       7. Persistent CSF (Cerebro-Spinal Fluid) leakage: This is a rare problem, but may occur  with prolonged intrathecal or epidural catheters either due to the formation of a fistulous  track or a dural tear.       8. Nerve damage: By working so close to the spinal cord, there is always a possibility of  nerve damage, which could be as serious as a permanent spinal cord injury with  paralysis.       9. Death:  Although rare, severe deadly allergic reactions known as "Anaphylactic  reaction" can occur to any of the medications used.      10. Worsening of the symptoms:  We can always make thing worse.    What are the chances of something like this happening? Chances of any of this occuring are extremely low.  By statistics, you have more of a chance of getting killed in a motor vehicle accident: while driving to the hospital than any of the above occurring .  Nevertheless, you should be aware that they are possibilities.  In general, it is similar to taking a shower.  Everybody knows that you can slip, hit your head and get killed.  Does that mean that you should not shower again?  Nevertheless always keep in mind that statistics do not mean anything if you happen to be on the wrong side of them.  Even if a procedure has a 1  (one) in a 1,000,000 (million) chance of going wrong, it you happen to be that one..Also, keep in mind that by statistics, you have more of a chance of having something go wrong when taking medications.  Who should not have this procedure? If you are on a blood thinning medication (e.g. Coumadin, Plavix, see list of "Blood Thinners"), or if you have an active infection going on, you should not have the procedure.  If you are taking any blood thinners, please inform your physician.  How should I prepare for this procedure?  Do not eat or drink anything at least six hours prior to the procedure.  Bring a driver with you .  It cannot be a taxi.  Come accompanied by an adult that can drive you back, and that is strong enough to help you if your legs get weak or numb from the local anesthetic.  Take all of your medicines the morning of the procedure with just enough water to swallow them.  If you have diabetes, make sure that you are scheduled to have your procedure done first thing in the morning, whenever possible.  If you have diabetes, take only half of your insulin dose and notify our nurse that you have done so as soon as you arrive at the clinic.  If you are diabetic, but only take blood sugar pills (oral hypoglycemic), then do not take them on the morning of your procedure.  You may take them after you have had the procedure.  Do not take aspirin or any aspirin-containing medications, at least eleven (11) days prior to the procedure.  They may prolong bleeding.  Wear loose fitting clothing that may be easy to take off and that you would not mind if it got stained with Betadine or blood.  Do not wear any jewelry or perfume  Remove any nail coloring.  It will interfere with some of our monitoring equipment.  NOTE: Remember that this is not meant to be interpreted as a complete list of all possible complications.  Unforeseen problems may occur.  BLOOD THINNERS The following drugs  contain aspirin or other products, which can cause increased bleeding during surgery and should not be taken for 2 weeks prior to and 1 week after surgery.  If you should need take something for relief of minor pain, you may take acetaminophen which is found in Tylenol,m Datril, Anacin-3 and Panadol. It is not blood thinner. The products listed below are.  Do not take any of the products listed below in addition to any listed on your instruction sheet.  A.P.C or A.P.C with Codeine Codeine Phosphate Capsules #3 Ibuprofen Ridaura  ABC compound Congesprin Imuran rimadil  Advil Cope Indocin Robaxisal  Alka-Seltzer Effervescent Pain Reliever and Antacid Coricidin or Coricidin-D  Indomethacin Rufen    Alka-Seltzer plus Cold Medicine Cosprin Ketoprofen S-A-C Tablets  Anacin Analgesic Tablets or Capsules Coumadin Korlgesic Salflex  Anacin Extra Strength Analgesic tablets or capsules CP-2 Tablets Lanoril Salicylate  Anaprox Cuprimine Capsules Levenox Salocol  Anexsia-D Dalteparin Magan Salsalate  Anodynos Darvon compound Magnesium Salicylate Sine-off  Ansaid Dasin Capsules Magsal Sodium Salicylate  Anturane Depen Capsules Marnal Soma  APF Arthritis pain formula Dewitt's Pills Measurin Stanback  Argesic Dia-Gesic Meclofenamic Sulfinpyrazone  Arthritis Bayer Timed Release Aspirin Diclofenac Meclomen Sulindac  Arthritis pain formula Anacin Dicumarol Medipren Supac  Analgesic (Safety coated) Arthralgen Diffunasal Mefanamic Suprofen  Arthritis Strength Bufferin Dihydrocodeine Mepro Compound Suprol  Arthropan liquid Dopirydamole Methcarbomol with Aspirin Synalgos  ASA tablets/Enseals Disalcid Micrainin Tagament  Ascriptin Doan's Midol Talwin  Ascriptin A/D Dolene Mobidin Tanderil  Ascriptin Extra Strength Dolobid Moblgesic Ticlid  Ascriptin with Codeine Doloprin or Doloprin with Codeine Momentum Tolectin  Asperbuf Duoprin Mono-gesic Trendar  Aspergum Duradyne Motrin or Motrin IB Triminicin  Aspirin  plain, buffered or enteric coated Durasal Myochrisine Trigesic  Aspirin Suppositories Easprin Nalfon Trillsate  Aspirin with Codeine Ecotrin Regular or Extra Strength Naprosyn Uracel  Atromid-S Efficin Naproxen Ursinus  Auranofin Capsules Elmiron Neocylate Vanquish  Axotal Emagrin Norgesic Verin  Azathioprine Empirin or Empirin with Codeine Normiflo Vitamin E  Azolid Emprazil Nuprin Voltaren  Bayer Aspirin plain, buffered or children's or timed BC Tablets or powders Encaprin Orgaran Warfarin Sodium  Buff-a-Comp Enoxaparin Orudis Zorpin  Buff-a-Comp with Codeine Equegesic Os-Cal-Gesic   Buffaprin Excedrin plain, buffered or Extra Strength Oxalid   Bufferin Arthritis Strength Feldene Oxphenbutazone   Bufferin plain or Extra Strength Feldene Capsules Oxycodone with Aspirin   Bufferin with Codeine Fenoprofen Fenoprofen Pabalate or Pabalate-SF   Buffets II Flogesic Panagesic   Buffinol plain or Extra Strength Florinal or Florinal with Codeine Panwarfarin   Buf-Tabs Flurbiprofen Penicillamine   Butalbital Compound Four-way cold tablets Penicillin   Butazolidin Fragmin Pepto-Bismol   Carbenicillin Geminisyn Percodan   Carna Arthritis Reliever Geopen Persantine   Carprofen Gold's salt Persistin   Chloramphenicol Goody's Phenylbutazone   Chloromycetin Haltrain Piroxlcam   Clmetidine heparin Plaquenil   Cllnoril Hyco-pap Ponstel   Clofibrate Hydroxy chloroquine Propoxyphen         Before stopping any of these medications, be sure to consult the physician who ordered them.  Some, such as Coumadin (Warfarin) are ordered to prevent or treat serious conditions such as "deep thrombosis", "pumonary embolisms", and other heart problems.  The amount of time that you may need off of the medication may also vary with the medication and the reason for which you were taking it.  If you are taking any of these medications, please make sure you notify your pain physician before you undergo any  procedures.         Pain Management Discharge Instructions  General Discharge Instructions :  If you need to reach your doctor call: Monday-Friday 8:00 am - 4:00 pm at [phone] or toll free [phone].  After clinic hours [phone] to have operator reach doctor.  Bring all of your medication bottles to all your appointments in the pain clinic.  To cancel or reschedule your appointment with Pain Management please remember to call 24 hours in advance to avoid a fee.  Refer to the educational materials which you have been given on: General Risks, I had my Procedure. Discharge Instructions, Post Sedation.  Post Procedure Instructions:  The drugs you were given will stay in your system until tomorrow, so for the next 24 hours you should   not drive, make any legal decisions or drink any alcoholic beverages.  You may eat anything you prefer, but it is better to start with liquids then soups and crackers, and gradually work up to solid foods.  Please notify your doctor immediately if you have any unusual bleeding, trouble breathing or pain that is not related to your normal pain.  Depending on the type of procedure that was done, some parts of your body may feel week and/or numb.  This usually clears up by tonight or the next day.  Walk with the use of an assistive device or accompanied by an adult for the 24 hours.  You may use ice on the affected area for the first 24 hours.  Put ice in a Ziploc bag and cover with a towel and place against area 15 minutes on 15 minutes off.  You may switch to heat after 24 hours.Epidural Steroid Injection Patient Information  Description: The epidural space surrounds the nerves as they exit the spinal cord.  In some patients, the nerves can be compressed and inflamed by a bulging disc or a tight spinal canal (spinal stenosis).  By injecting steroids into the epidural space, we can bring irritated nerves into direct contact with a potentially  helpful medication.  These steroids act directly on the irritated nerves and can reduce swelling and inflammation which often leads to decreased pain.  Epidural steroids may be injected anywhere along the spine and from the neck to the low back depending upon the location of your pain.   After numbing the skin with local anesthetic (like Novocaine), a small needle is passed into the epidural space slowly.  You may experience a sensation of pressure while this is being done.  The entire block usually last less than 10 minutes.  Conditions which may be treated by epidural steroids:   Low back and leg pain  Neck and arm pain  Spinal stenosis  Post-laminectomy syndrome  Herpes zoster (shingles) pain  Pain from compression fractures  Preparation for the injection:  1. Do not eat any solid food or dairy products within 8 hours of your appointment.  2. You may drink clear liquids up to 3 hours before appointment.  Clear liquids include water, black coffee, juice or soda.  No milk or cream please. 3. You may take your regular medication, including pain medications, with a sip of water before your appointment  Diabetics should hold regular insulin (if taken separately) and take 1/2 normal NPH dos the morning of the procedure.  Carry some sugar containing items with you to your appointment. 4. A driver must accompany you and be prepared to drive you home after your procedure.  5. Bring all your current medications with your. 6. An IV may be inserted and sedation may be given at the discretion of the physician.   7. A blood pressure cuff, EKG and other monitors will often be applied during the procedure.  Some patients may need to have extra oxygen administered for a short period. 8. You will be asked to provide medical information, including your allergies, prior to the procedure.  We must know immediately if you are taking blood thinners (like Coumadin/Warfarin)  Or if you are allergic to IV iodine  contrast (dye). We must know if you could possible be pregnant.  Possible side-effects:  Bleeding from needle site  Infection (rare, may require surgery)  Nerve injury (rare)  Numbness & tingling (temporary)  Difficulty urinating (rare, temporary)  Spinal headache (   a headache worse with upright posture)  Light -headedness (temporary)  Pain at injection site (several days)  Decreased blood pressure (temporary)  Weakness in arm/leg (temporary)  Pressure sensation in back/neck (temporary)  Call if you experience:  Fever/chills associated with headache or increased back/neck pain.  Headache worsened by an upright position.  New onset weakness or numbness of an extremity below the injection site  Hives or difficulty breathing (go to the emergency room)  Inflammation or drainage at the infection site  Severe back/neck pain  Any new symptoms which are concerning to you  Please note:  Although the local anesthetic injected can often make your back or neck feel good for several hours after the injection, the pain will likely return.  It takes 3-7 days for steroids to work in the epidural space.  You may not notice any pain relief for at least that one week.  If effective, we will often do a series of three injections spaced 3-6 weeks apart to maximally decrease your pain.  After the initial series, we generally will wait several months before considering a repeat injection of the same type.  If you have any questions, please call [phone] [facility] 

## 2020-10-24 ENCOUNTER — Telehealth: Payer: Self-pay | Admitting: *Deleted

## 2020-10-24 NOTE — Telephone Encounter (Signed)
Attempted to call for post procedure follow-up. Message left. 

## 2020-11-21 ENCOUNTER — Encounter: Payer: Self-pay | Admitting: Student in an Organized Health Care Education/Training Program

## 2020-11-22 ENCOUNTER — Encounter: Payer: Self-pay | Admitting: Student in an Organized Health Care Education/Training Program

## 2020-11-22 ENCOUNTER — Ambulatory Visit
Payer: Medicare PPO | Attending: Student in an Organized Health Care Education/Training Program | Admitting: Student in an Organized Health Care Education/Training Program

## 2020-11-22 ENCOUNTER — Other Ambulatory Visit: Payer: Self-pay

## 2020-11-22 DIAGNOSIS — G894 Chronic pain syndrome: Secondary | ICD-10-CM | POA: Diagnosis not present

## 2020-11-22 DIAGNOSIS — G8929 Other chronic pain: Secondary | ICD-10-CM | POA: Diagnosis not present

## 2020-11-22 DIAGNOSIS — M47816 Spondylosis without myelopathy or radiculopathy, lumbar region: Secondary | ICD-10-CM | POA: Diagnosis not present

## 2020-11-22 DIAGNOSIS — M5416 Radiculopathy, lumbar region: Secondary | ICD-10-CM

## 2020-11-22 MED ORDER — HYDROCODONE-ACETAMINOPHEN 5-325 MG PO TABS: 0.5000 | ORAL_TABLET | Freq: Two times a day (BID) | ORAL | 0 refills | Status: AC | PRN

## 2020-11-22 NOTE — Progress Notes (Signed)
Patient: Christy Mccarthy  Service Category: E/M  Provider: Gillis Santa, MD  DOB: 1941-08-28  DOS: 11/22/2020  Location: Office  MRN: [id number]  Setting: Ambulatory outpatient  Referring Provider: [facility]  Type: Established Patient  Specialty: Interventional Pain Management  PCP: [facility]  Location: Home  Delivery: TeleHealth     Virtual Encounter - Pain Management PROVIDER NOTE: Information contained herein reflects review and annotations entered in association with encounter. Interpretation of such information and data should be left to medically-trained personnel. Information provided to patient can be located elsewhere in the medical record under "Patient Instructions". Document created using STT-dictation technology, any transcriptional errors that may result from process are unintentional.    Contact & Pharmacy Preferred: [phone] Home: [phone] (home) Mobile: [phone] (mobile) E-mail: [email or web address] [place], [place] [address] Phone: [phone] Fax: [phone]  [facility] [address] [place], [place] [address] [place] [address] [place] [address] Phone: [phone] Fax: [phone]   Pre-screening  [name] offered "in-person" vs "virtual" encounter. She indicated preferring virtual for this encounter.   Reason COVID-19*  Social distancing based on CDC and AMA recommendations.   I contacted Archana Eckman on 11/22/2020 via telephone.      I clearly identified myself as Gillis Santa, MD. I verified that I was speaking with the correct person using two identifiers (Name: Christy Mccarthy, and date of birth: 05/31/1941).  Consent I sought verbal advanced consent from Aletha Halim for virtual visit interactions. I informed [name] of possible security and privacy concerns, risks, and  limitations associated with providing "not-in-person" medical evaluation and management services. I also informed [name] of the availability of "in-person" appointments. Finally, I informed her that there would be a charge for the virtual visit and that she could be  personally, fully or partially, financially responsible for it. Ms. Lizardo expressed understanding and agreed to proceed.   Historic Elements   Christy Mccarthy is a 79 y.o. year old, female patient evaluated today after our last contact on 10/23/2020. [name]  has a past medical history of Chronic constipation and GERD (gastroesophageal reflux disease). She also  has a past surgical history that includes Appendectomy; Abdominal hysterectomy; and Esophagogastroduodenoscopy (N/A, 09/06/2014). Christy Mccarthy has a current medication list which includes the following prescription(s): alprazolam, aspirin ec, atorvastatin, cilostazol, losartan, meloxicam, methocarbamol, multiple vitamins-minerals, pantoprazole, and hydrocodone-acetaminophen. She  reports that she has been smoking cigarettes. She has a 6.25 pack-year smoking history. She has never used smokeless tobacco. She reports that she does not drink alcohol and does not use drugs. Christy Mccarthy is allergic to neurontin [gabapentin], linzess [linaclotide], sucralfate, and sulfa antibiotics.   HPI  Today, she is being contacted for a post-procedure assessment.   Post-Procedure Evaluation  Procedure (10/23/2020):   Type: Therapeutic Inter-Laminar Epidural Steroid Injection  #2 (#1 done 11/15/2019) Region: Lumbar Level: L4-5 Level. Laterality: Midline   Sedation: Please see nurses note.  Effectiveness during initial hour after procedure(Ultra-Short Term Relief): 100 %   Local anesthetic used: Long-acting (4-6 hours) Effectiveness: Defined as any analgesic benefit obtained secondary to the administration of local anesthetics. This carries significant diagnostic value as to the  etiological location, or anatomical origin, of the pain. Duration of benefit is expected to coincide with the duration of the local anesthetic used.  Effectiveness  during initial 4-6 hours after procedure(Short-Term Relief): 100 %  Long-term benefit: Defined as any relief past the pharmacologic duration of the local anesthetics.  Effectiveness past the initial 6 hours after procedure(Long-Term Relief): 0 % (Pt stated that it didnt help at all)   Current benefits: Defined as benefit that persist at this time.   Analgesia:  No benefit Function: No benefit ROM: No benefit  Laboratory Chemistry Profile   Renal Lab Results  Component Value Date   BUN 19 08/21/2014   CREATININE 0.66 08/21/2014   GFRAA >90 08/21/2014   GFRNONAA 84 (L) 08/21/2014     Hepatic Lab Results  Component Value Date   AST 21 08/21/2014   ALT 12 08/21/2014   ALBUMIN 3.5 08/21/2014   ALKPHOS 83 08/21/2014   LIPASE 67 (L) 08/17/2014     Electrolytes Lab Results  Component Value Date   NA 140 08/21/2014   K 3.5 (L) 08/21/2014   CL 98 08/21/2014   CALCIUM 9.7 08/21/2014     Bone No results found for: VD25OH, VD125OH2TOT, DU2025KY7, CW2376EG3, 25OHVITD1, 25OHVITD2, 25OHVITD3, TESTOFREE, TESTOSTERONE   Inflammation (CRP: Acute Phase) (ESR: Chronic Phase) No results found for: CRP, ESRSEDRATE, LATICACIDVEN     Note: Above Lab results reviewed.   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 Diagnoses of Lumbar facet arthropathy, Lumbar spondylosis, and Chronic pain syndrome were also pertinent to this visit.  Plan of Care  Christy Mccarthy has a current medication list which includes the following long-term medication(s): atorvastatin, losartan, and pantoprazole.  Unfortunately, no benefit after patient's midline L4-L5 epidural steroid injection that was done on 10/23/2020.  She states that she experienced much more pain relief with her right L4-L5 epidural  steroid injection that was done on 11/15/2019.  She states that she cannot afford another injection at this time.  She states that she has obtained pain relief from hydrocodone and she states that she has severe pain.  She takes Robaxin 500 mg twice daily as needed.  She has tried gabapentin in the past which was not effective.  I will send in a prescription for hydrocodone as below for the patient to utilize on an as-needed basis when she has severe pain.  Patient will follow up for repeat lumbar epidural steroid injection as needed and will contact us when she needs medication refills and she takes her hydrocodone very sporadically.  Patient endorsed understanding.  Pharmacotherapy (Medications Ordered): Meds ordered this encounter  Medications  . HYDROcodone-acetaminophen (NORCO/VICODIN) 5-325 MG tablet    Sig: Take 0.5-1 tablets by mouth 2 (two) times daily as needed for severe pain. Must last 30 days.    Dispense:  60 tablet    Refill:  0    Chronic Pain. (STOP Act - Not applicable). Fill one day early if closed on scheduled refill date.   Orders Placed This Encounter  Procedures  . Lumbar Epidural Injection    Standing Status:   Standing    Number of Occurrences:   9    Standing Expiration Date:   11/22/2021    Scheduling Instructions:     Purpose: Palliative     Indication: Lower extremity pain/Sciatica unspecified side (M54.30).     Side: right l4/5     Level: TBD     Sedation: Patient's choice.     TIMEFRAME: PRN procedure. (Ms. Kinnamon will call when needed.)    Order Specific Question:   Where will this procedure be performed?  Answer:   [facility]    Follow-up plan:   Return if symptoms worsen or fail to improve.     s/p RIGHT L4/5 ESI #1 on 11/15/2019, 7 cc injected, 10/23/20: 7 cc injected    Recent Visits Date Type Provider Dept  10/23/20 Procedure visit Gillis Santa, MD [facility]  Showing recent visits within past 90 days and meeting all other  requirements Today's Visits Date Type Provider Dept  11/22/20 Telemedicine Gillis Santa, MD [facility]  Showing today's visits and meeting all other requirements Future Appointments No visits were found meeting these conditions. Showing future appointments within next 90 days and meeting all other requirements  I discussed the assessment and treatment plan with the patient. The patient was provided an opportunity to ask questions and all were answered. The patient agreed with the plan and demonstrated an understanding of the instructions.  Patient advised to call back or seek an in-person evaluation if the symptoms or condition worsens.  Duration of encounter: 29mnutes.  Note by: BGillis Santa MD Date: 11/22/2020; Time: 3:45 PM

## 2021-01-16 ENCOUNTER — Other Ambulatory Visit: Payer: Self-pay | Admitting: Family Medicine

## 2021-01-16 ENCOUNTER — Other Ambulatory Visit: Payer: Self-pay

## 2021-01-16 ENCOUNTER — Ambulatory Visit
Admission: RE | Admit: 2021-01-16 | Discharge: 2021-01-16 | Disposition: A | Discharge status: Home / Self Care | Payer: Medicare HMO | Source: Ambulatory Visit | Attending: Family Medicine | Admitting: Family Medicine

## 2021-01-16 DIAGNOSIS — R0602 Shortness of breath: Secondary | ICD-10-CM

## 2021-01-16 DIAGNOSIS — R000 Tachycardia, unspecified: Secondary | ICD-10-CM

## 2021-01-16 DIAGNOSIS — R55 Syncope and collapse: Secondary | ICD-10-CM

## 2021-01-16 HISTORY — DX: Essential (primary) hypertension: I10

## 2021-01-16 LAB — POCT I-STAT CREATININE: Creatinine, Ser: 1 mg/dL (ref 0.44–1.00)

## 2021-01-16 MED ORDER — IOHEXOL 350 MG/ML SOLN
75.0000 mL | Freq: Once | INTRAVENOUS | Status: AC | PRN
  Administered 2021-01-16: 75 mL via INTRAVENOUS

## 2021-02-07 IMAGING — CT CT ANGIO CHEST
2 of 6 series · 18 of 46 positions shown · IV contrast (APPLIED)
Comparison: None.

CLINICAL DATA: 79-year-old female with shortness of breath.
[id number].

EXAM:
CT ANGIOGRAPHY CHEST WITH CONTRAST
TECHNIQUE: Multidetector CT imaging of the chest was performed using the
standard protocol during bolus administration of intravenous
contrast. Multiplanar CT image reconstructions and MIPs were
obtained to evaluate the vascular anatomy.
CONTRAST:  75mL OMNIPAQUE IOHEXOL 350 MG/ML SOLN

[Series 5: thins · axial · 0.55mm/px · z∈[-298,-15]mm · 16 of 311 slices shown]
[im 14/311  lung]
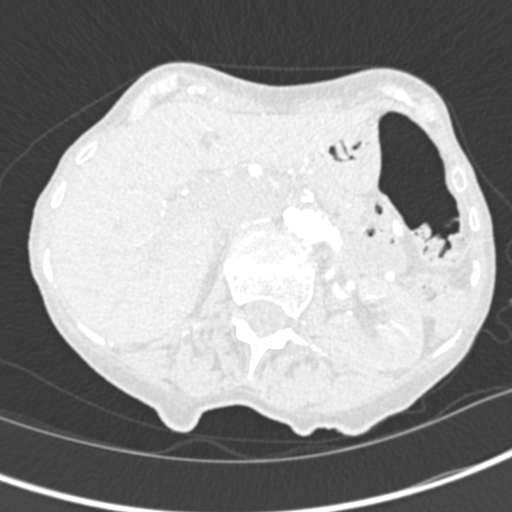
[im 41/311  soft-tissue]
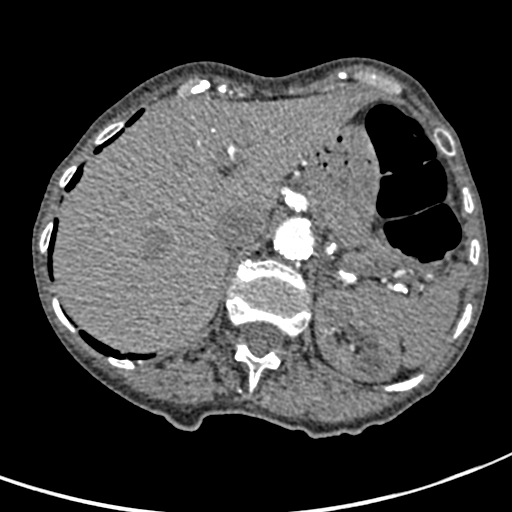
[im 54/311  lung]
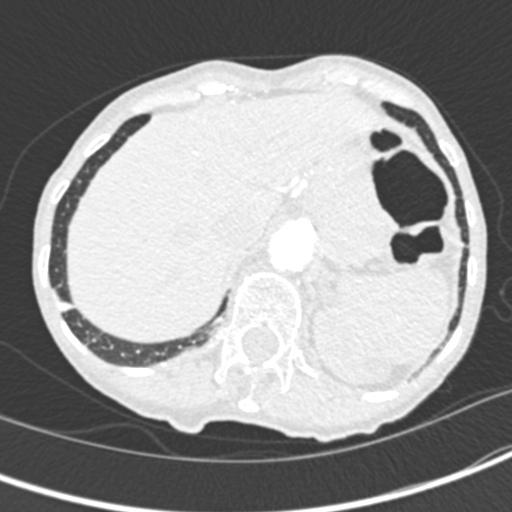
[im 68/311  soft-tissue]
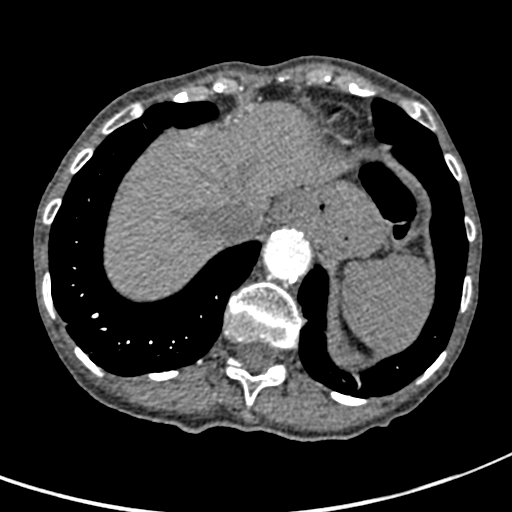
[im 95/311  lung]
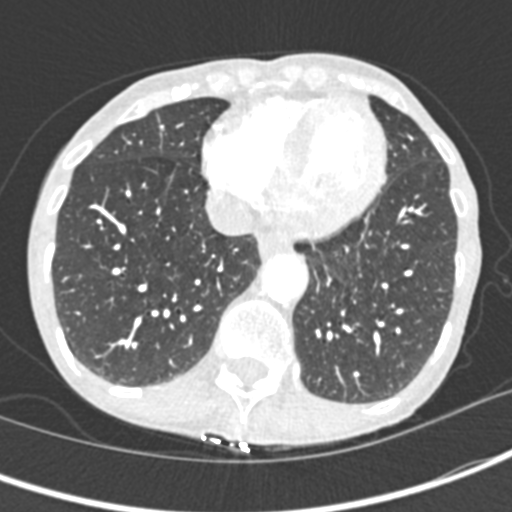
[im 108/311  soft-tissue]
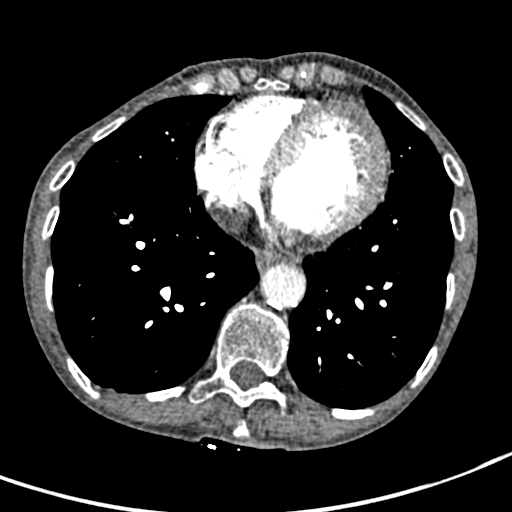
[im 122/311  lung]
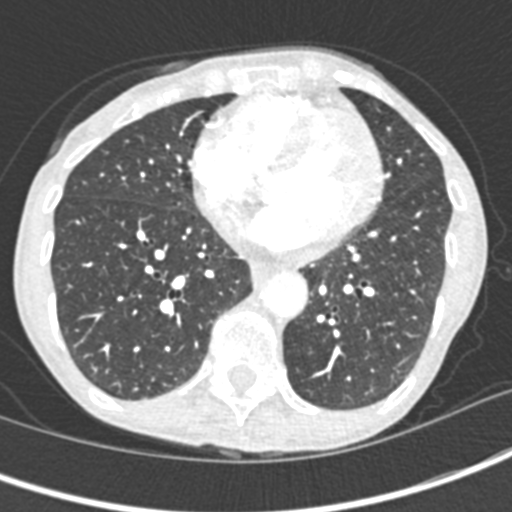
[im 149/311  soft-tissue]
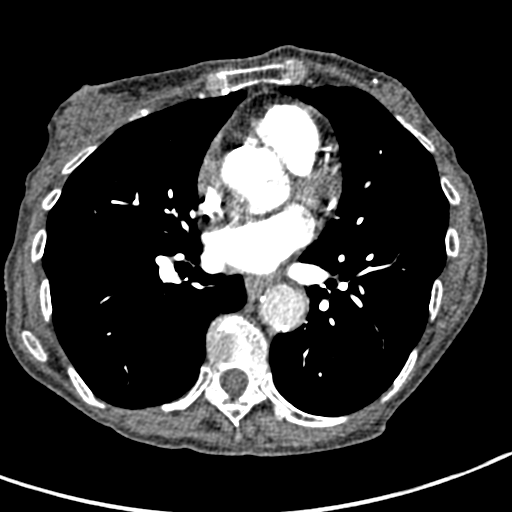
[im 162/311  lung]
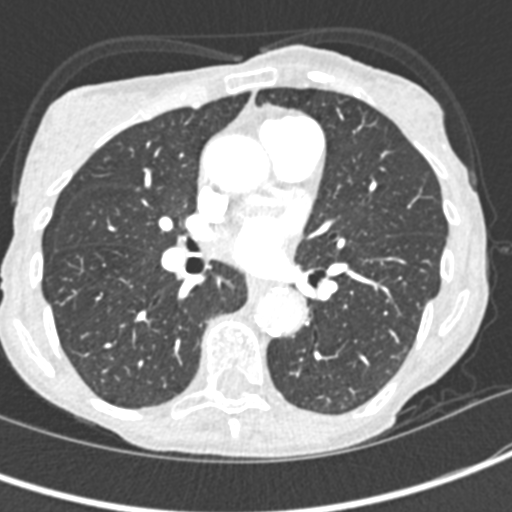
[im 189/311  soft-tissue]
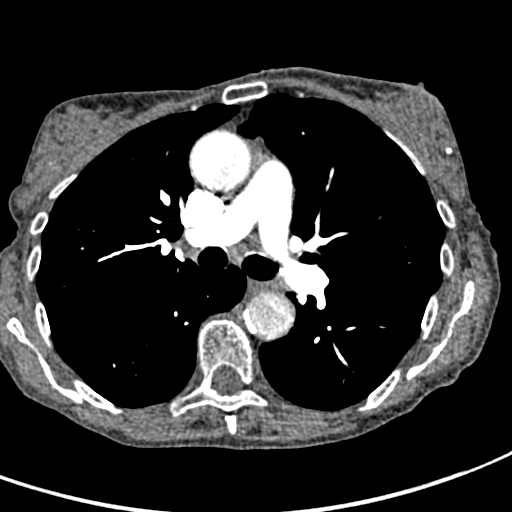
[im 203/311  lung]
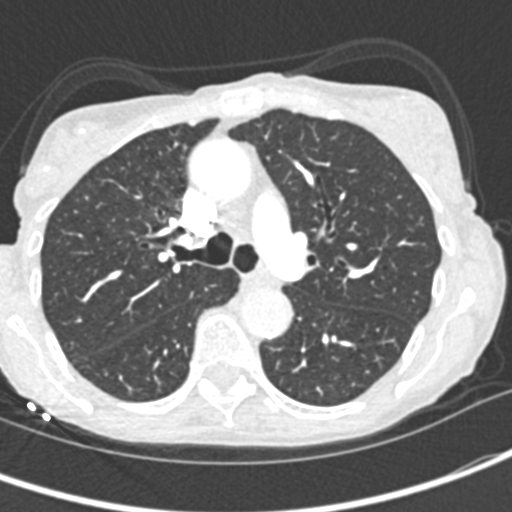
[im 216/311  soft-tissue]
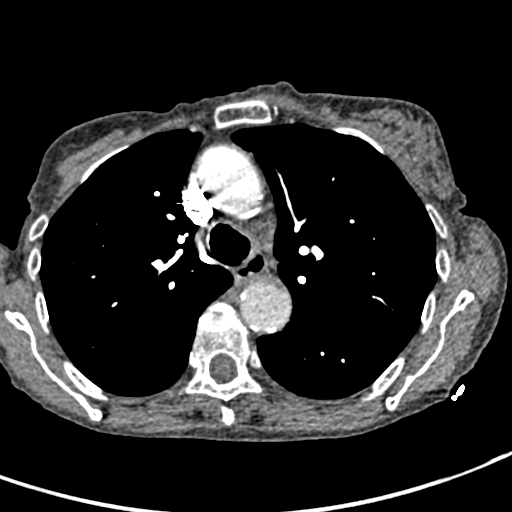
[im 243/311  lung]
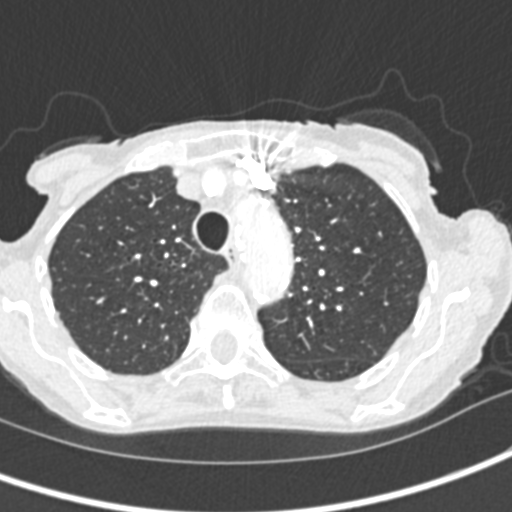
[im 257/311  soft-tissue]
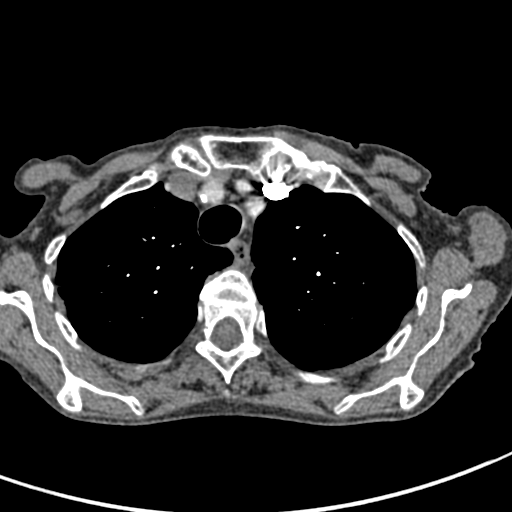
[im 270/311  lung]
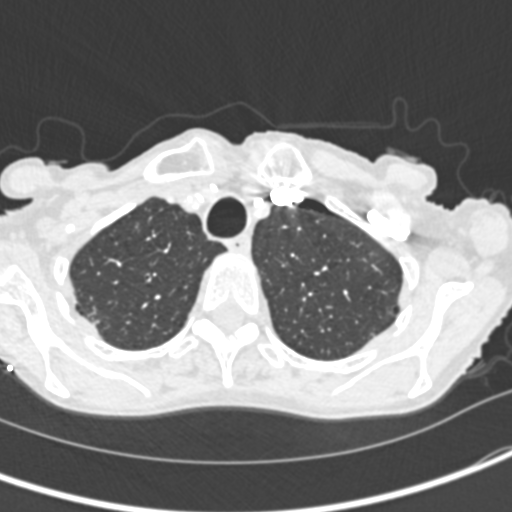
[im 297/311  soft-tissue]
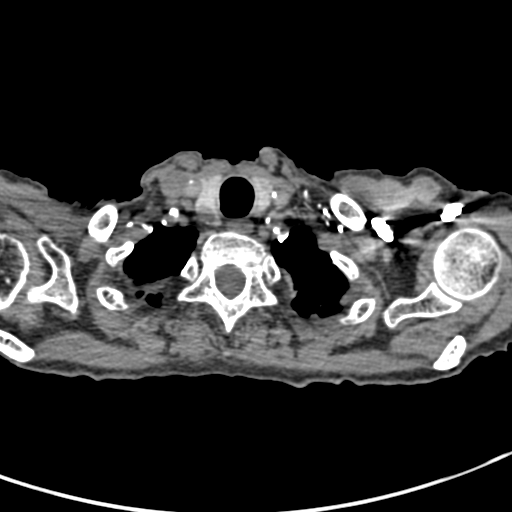

[Series 7: coronal mpr · coronal · 0.58mm/px · 2 of 76 slices shown]
[im 26/76  soft-tissue]
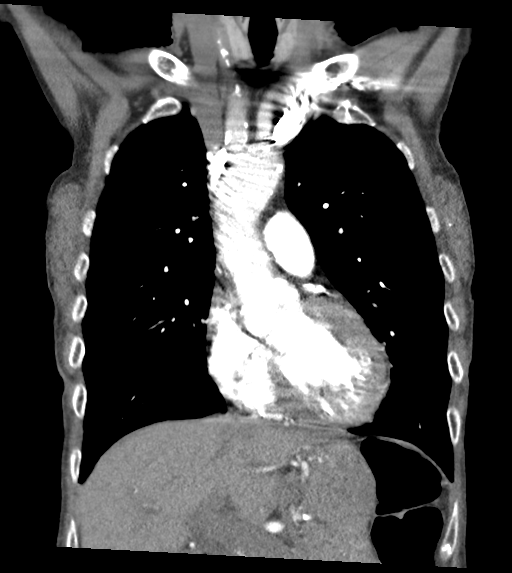
[im 51/76  soft-tissue]
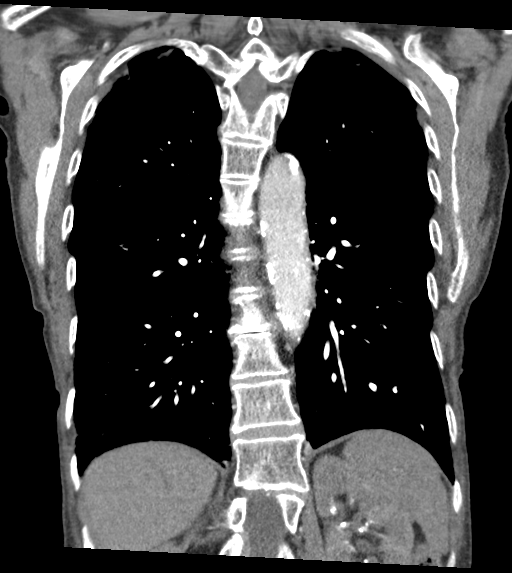

[18 of 46 positions shown; findings below may reference images not displayed]

FINDINGS: Cardiovascular: There is no cardiomegaly or pericardial effusion.
There is 3 vessel coronary vascular calcification. Advanced
atherosclerotic calcification of the thoracic aorta. No aneurysmal
dilatation or dissection. No pulmonary artery embolus identified.

Mediastinum/Nodes: There is no hilar or mediastinal adenopathy. The
esophagus is grossly unremarkable. There is a 15 mm right thyroid
hypodense nodule. In the setting of significant comorbidities or
limited life expectancy, no follow-up recommended (ref: [HOSPITAL]. [DATE]): 143-50).No mediastinal fluid collection.

Lungs/Pleura: Faint small scattered clusters of ground-glass opacity
primarily involving the right lung most concerning for developing
infiltrate, possibly atypical in etiology. No lobar consolidation,
pleural effusion, pneumothorax. The central airways are patent.

Upper Abdomen: Indeterminate left renal upper pole hypodense
lesions, likely cysts.

Musculoskeletal: Osteopenia with degenerative changes of the spine.
There is loss of subcutaneous fat. No acute osseous pathology.

Review of the MIP images confirms the above findings.
IMPRESSION: 1. No CT evidence of pulmonary artery embolus.
2. Faint small scattered clusters of ground-glass opacity primarily
involving the right lung most concerning for developing infiltrate,
possibly atypical in etiology. Clinical correlation and follow-up to
resolution recommended.
3. Aortic Atherosclerosis ([id number]).

## 2021-06-20 ENCOUNTER — Other Ambulatory Visit: Payer: Self-pay | Admitting: Gastroenterology

## 2021-06-20 DIAGNOSIS — K219 Gastro-esophageal reflux disease without esophagitis: Secondary | ICD-10-CM

## 2021-06-20 DIAGNOSIS — R102 Pelvic and perineal pain: Secondary | ICD-10-CM

## 2021-06-20 DIAGNOSIS — R634 Abnormal weight loss: Secondary | ICD-10-CM

## 2021-06-20 DIAGNOSIS — K5909 Other constipation: Secondary | ICD-10-CM

## 2021-06-20 DIAGNOSIS — D509 Iron deficiency anemia, unspecified: Secondary | ICD-10-CM

## 2021-09-10 ENCOUNTER — Ambulatory Visit: Admission: RE | Admit: 2021-09-10 | Payer: Medicare PPO | Source: Home / Self Care

## 2021-09-10 ENCOUNTER — Encounter: Admission: RE | Payer: Self-pay | Source: Home / Self Care

## 2021-09-10 SURGERY — EGD (ESOPHAGOGASTRODUODENOSCOPY)
Anesthesia: General

## 2022-03-21 ENCOUNTER — Encounter: Payer: Self-pay | Admitting: *Deleted

## 2023-10-23 DIAGNOSIS — D0471 Carcinoma in situ of skin of right lower limb, including hip: Secondary | ICD-10-CM | POA: Diagnosis not present

## 2023-12-11 DIAGNOSIS — M51369 Other intervertebral disc degeneration, lumbar region without mention of lumbar back pain or lower extremity pain: Secondary | ICD-10-CM | POA: Diagnosis not present

## 2023-12-11 DIAGNOSIS — M810 Age-related osteoporosis without current pathological fracture: Secondary | ICD-10-CM | POA: Diagnosis not present

## 2023-12-11 DIAGNOSIS — Z720 Tobacco use: Secondary | ICD-10-CM | POA: Diagnosis not present

## 2023-12-11 DIAGNOSIS — C4490 Unspecified malignant neoplasm of skin, unspecified: Secondary | ICD-10-CM | POA: Diagnosis not present

## 2023-12-11 DIAGNOSIS — M48061 Spinal stenosis, lumbar region without neurogenic claudication: Secondary | ICD-10-CM | POA: Diagnosis not present

## 2023-12-11 DIAGNOSIS — I700 Atherosclerosis of aorta: Secondary | ICD-10-CM | POA: Diagnosis not present

## 2023-12-11 DIAGNOSIS — R252 Cramp and spasm: Secondary | ICD-10-CM | POA: Diagnosis not present

## 2023-12-11 DIAGNOSIS — F17210 Nicotine dependence, cigarettes, uncomplicated: Secondary | ICD-10-CM | POA: Diagnosis not present

## 2023-12-11 DIAGNOSIS — I739 Peripheral vascular disease, unspecified: Secondary | ICD-10-CM | POA: Diagnosis not present

## 2023-12-11 DIAGNOSIS — Z0000 Encounter for general adult medical examination without abnormal findings: Secondary | ICD-10-CM | POA: Diagnosis not present

## 2023-12-11 DIAGNOSIS — I10 Essential (primary) hypertension: Secondary | ICD-10-CM | POA: Diagnosis not present

## 2023-12-11 DIAGNOSIS — F419 Anxiety disorder, unspecified: Secondary | ICD-10-CM | POA: Diagnosis not present

## 2023-12-11 DIAGNOSIS — R7309 Other abnormal glucose: Secondary | ICD-10-CM | POA: Diagnosis not present

## 2023-12-11 DIAGNOSIS — R457 State of emotional shock and stress, unspecified: Secondary | ICD-10-CM | POA: Diagnosis not present

## 2023-12-11 DIAGNOSIS — K5900 Constipation, unspecified: Secondary | ICD-10-CM | POA: Diagnosis not present
